# Patient Record
Sex: Male | Born: 1970 | Race: White | Hispanic: No | Marital: Married | Smoking: Former smoker
Health system: Southern US, Community
[De-identification: ages and names within clinical notes are randomized; demographics above are authoritative.]

## PROBLEM LIST (undated history)

## (undated) DIAGNOSIS — I2510 Atherosclerotic heart disease of native coronary artery without angina pectoris: Secondary | ICD-10-CM

## (undated) DIAGNOSIS — I10 Essential (primary) hypertension: Secondary | ICD-10-CM

## (undated) DIAGNOSIS — E785 Hyperlipidemia, unspecified: Secondary | ICD-10-CM

## (undated) DIAGNOSIS — J309 Allergic rhinitis, unspecified: Secondary | ICD-10-CM

## (undated) HISTORY — DX: Allergic rhinitis, unspecified: J30.9

## (undated) HISTORY — PX: NO PAST SURGERIES: SHX2092

## (undated) HISTORY — DX: Essential (primary) hypertension: I10

## (undated) HISTORY — DX: Atherosclerotic heart disease of native coronary artery without angina pectoris: I25.10

## (undated) HISTORY — DX: Hyperlipidemia, unspecified: E78.5

## (undated) HISTORY — PX: ELBOW SURGERY: SHX618

---

## 2007-05-03 DIAGNOSIS — R030 Elevated blood-pressure reading, without diagnosis of hypertension: Secondary | ICD-10-CM | POA: Insufficient documentation

## 2007-05-06 DIAGNOSIS — I10 Essential (primary) hypertension: Secondary | ICD-10-CM | POA: Insufficient documentation

## 2007-05-08 ENCOUNTER — Emergency Department (HOSPITAL_COMMUNITY): Admission: EM | Admit: 2007-05-08 | Discharge: 2007-05-08 | Payer: Self-pay | Admitting: Family Medicine

## 2008-09-09 ENCOUNTER — Emergency Department (HOSPITAL_COMMUNITY): Admission: EM | Admit: 2008-09-09 | Discharge: 2008-09-09 | Payer: Self-pay | Admitting: Emergency Medicine

## 2008-09-13 ENCOUNTER — Emergency Department: Payer: Self-pay | Admitting: Emergency Medicine

## 2009-08-21 DIAGNOSIS — J309 Allergic rhinitis, unspecified: Secondary | ICD-10-CM | POA: Insufficient documentation

## 2010-12-02 LAB — CBC
HCT: 44.3
Hemoglobin: 15.3
MCHC: 34.5
MCV: 87.5
Platelets: 268
RBC: 5.07
RDW: 11.7
WBC: 7.7

## 2010-12-02 LAB — DIFFERENTIAL
Basophils Absolute: 0.1
Basophils Relative: 1
Eosinophils Absolute: 0.2
Eosinophils Relative: 2
Lymphocytes Relative: 40
Lymphs Abs: 3.1
Monocytes Absolute: 0.6
Monocytes Relative: 8
Neutro Abs: 3.7
Neutrophils Relative %: 49

## 2010-12-02 LAB — I-STAT 8, (EC8 V) (CONVERTED LAB)
BUN: 11
Bicarbonate: 23.9
Chloride: 107
Glucose, Bld: 86
HCT: 46
Hemoglobin: 15.6
Operator id: 196461
Potassium: 4.3
Sodium: 138
TCO2: 25
pCO2, Ven: 37.9 — ABNORMAL LOW
pH, Ven: 7.408 — ABNORMAL HIGH

## 2010-12-02 LAB — POCT CARDIAC MARKERS
CKMB, poc: <1 — ABNORMAL LOW
Myoglobin, poc: 35.5
Operator id: 234501
Troponin i, poc: <0.05

## 2010-12-02 LAB — POCT I-STAT CREATININE
Creatinine, Ser: 1
Operator id: 196461

## 2012-09-23 ENCOUNTER — Ambulatory Visit: Payer: Self-pay | Admitting: Family Medicine

## 2015-03-24 ENCOUNTER — Ambulatory Visit (INDEPENDENT_AMBULATORY_CARE_PROVIDER_SITE_OTHER): Payer: BC Managed Care – PPO | Admitting: Family Medicine

## 2015-03-24 ENCOUNTER — Encounter: Payer: Self-pay | Admitting: Family Medicine

## 2015-03-24 VITALS — BP 118/84 | HR 100 | Temp 98.9°F | Resp 17 | Wt 138.0 lb

## 2015-03-24 DIAGNOSIS — J069 Acute upper respiratory infection, unspecified: Secondary | ICD-10-CM | POA: Diagnosis not present

## 2015-03-24 DIAGNOSIS — Z716 Tobacco abuse counseling: Secondary | ICD-10-CM | POA: Diagnosis not present

## 2015-03-24 DIAGNOSIS — E785 Hyperlipidemia, unspecified: Secondary | ICD-10-CM | POA: Insufficient documentation

## 2015-03-24 DIAGNOSIS — Z720 Tobacco use: Secondary | ICD-10-CM

## 2015-03-24 DIAGNOSIS — R5383 Other fatigue: Secondary | ICD-10-CM | POA: Insufficient documentation

## 2015-03-24 DIAGNOSIS — M47816 Spondylosis without myelopathy or radiculopathy, lumbar region: Secondary | ICD-10-CM | POA: Insufficient documentation

## 2015-03-24 DIAGNOSIS — K529 Noninfective gastroenteritis and colitis, unspecified: Secondary | ICD-10-CM | POA: Insufficient documentation

## 2015-03-24 DIAGNOSIS — M549 Dorsalgia, unspecified: Secondary | ICD-10-CM | POA: Insufficient documentation

## 2015-03-24 MED ORDER — VARENICLINE TARTRATE 0.5 MG X 11 & 1 MG X 42 PO MISC: ORAL | Status: DC

## 2015-03-24 MED ORDER — HYDROCODONE-HOMATROPINE 5-1.5 MG/5ML PO SYRP: ORAL_SOLUTION | ORAL | Status: DC

## 2015-03-24 NOTE — Progress Notes (Signed)
Subjective:     Patient ID: Kyle KempfCharles D Clark, male   DOB: [birth year], 45 y.o.   MRN: [id number]  HPI  Chief Complaint  Patient presents with  . Sinus Problem    Patient reports fort the past 6 days he has congestion in his head and chest. Patient states "my head feels like its going to explode!", patient states that he has tried taking otc cough syrup and tylenol. Associated symptoms include: ear pressure/popping, barky like cough, runny nose and post nasal drainage.   . Nicotine Dependence    Patient would like to disucss today quiting smoking and starting back on Chantix, patient reports that he smokes 1/2 a pack a day. Patient last quit smoking 5 years ago then started back a year later, patient has been previously on chantix and statesd that it helped him before.   States sore throat is improving but continues to have PND and intermittently purulent appearing congestion. Reports a grandchild is sick as well. Has decreased smoking while ill.   Review of Systems  Constitutional: Positive for chills. Negative for fever.       Objective:   Physical Exam  Constitutional: He appears well-developed. No distress.  Ears: T.M's intact without inflammation Sinuses: mild maxillary sinus tenderness Throat: no tonsillar enlargement or exudate Neck: no cervical adenopathy Lungs: clear     Assessment:    1. Upper respiratory infectio - HYDROcodone-homatropine (HYCODAN) 5-1.5 MG/5ML syrup; 5 ml 4-6 hours as needed for cough  Dispense: 240 mL; Refill: 0  2. Encounter for smoking cessation counseling - varenicline (CHANTIX STARTING MONTH PAK) 0.5 MG X 11 & 1 MG X 42 tablet; Take one 0.5 mg tablet by mouth once daily for 3 days, then increase to one 0.5 mg tablet twice daily for 4 days, then increase to one 1 mg tablet twice daily.  Dispense: 53 tablet; Refill: 0    Plan:    Discussed use of topical and oral decongestants.

## 2015-03-24 NOTE — Patient Instructions (Signed)
Discussed use of Mucinex D and a nasal decongestant spray like Afrin for no more than 3 days. Call if sinuses not improving over the next two day.

## 2015-04-26 ENCOUNTER — Telehealth: Payer: Self-pay | Admitting: Family Medicine

## 2015-04-26 ENCOUNTER — Other Ambulatory Visit: Payer: Self-pay | Admitting: Family Medicine

## 2015-04-26 DIAGNOSIS — Z720 Tobacco use: Secondary | ICD-10-CM

## 2015-04-26 MED ORDER — VARENICLINE TARTRATE 1 MG PO TABS: 1.0000 mg | ORAL_TABLET | Freq: Two times a day (BID) | ORAL | Status: DC

## 2015-04-26 NOTE — Telephone Encounter (Signed)
Pt contacted office for refill request on the following medications:  varenicline (CHANTIX STARTING MONTH PAK) 0.5 MG X 11 & 1 MG X 42 tablet.  [facility] [address] [facility].  CB#[phone]/MW   Pt is requesting the 2nd dosage/MW

## 2015-04-26 NOTE — Telephone Encounter (Signed)
Please review. Thanks!  

## 2015-04-26 NOTE — Telephone Encounter (Signed)
Continuation pack sent in.

## 2015-04-26 NOTE — Telephone Encounter (Signed)
Advised pt's wife as below.  

## 2016-02-19 ENCOUNTER — Emergency Department
Admission: EM | Admit: 2016-02-19 | Discharge: 2016-02-20 | Disposition: A | Discharge status: Home / Self Care | Payer: BC Managed Care – PPO | Attending: Emergency Medicine | Admitting: Emergency Medicine

## 2016-02-19 ENCOUNTER — Encounter: Payer: Self-pay | Admitting: Emergency Medicine

## 2016-02-19 DIAGNOSIS — S0181XA Laceration without foreign body of other part of head, initial encounter: Secondary | ICD-10-CM | POA: Diagnosis not present

## 2016-02-19 DIAGNOSIS — Y999 Unspecified external cause status: Secondary | ICD-10-CM | POA: Insufficient documentation

## 2016-02-19 DIAGNOSIS — F17210 Nicotine dependence, cigarettes, uncomplicated: Secondary | ICD-10-CM | POA: Insufficient documentation

## 2016-02-19 DIAGNOSIS — Y939 Activity, unspecified: Secondary | ICD-10-CM | POA: Insufficient documentation

## 2016-02-19 DIAGNOSIS — Z79899 Other long term (current) drug therapy: Secondary | ICD-10-CM | POA: Insufficient documentation

## 2016-02-19 DIAGNOSIS — Y929 Unspecified place or not applicable: Secondary | ICD-10-CM | POA: Insufficient documentation

## 2016-02-19 DIAGNOSIS — Z23 Encounter for immunization: Secondary | ICD-10-CM | POA: Insufficient documentation

## 2016-02-19 DIAGNOSIS — I10 Essential (primary) hypertension: Secondary | ICD-10-CM | POA: Diagnosis not present

## 2016-02-19 MED ORDER — LIDOCAINE HCL (PF) 1 % IJ SOLN
INTRAMUSCULAR | Status: AC
  Administered 2016-02-20: 5 mL via INTRADERMAL
  Filled 2016-02-19: qty 5
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
## 2016-02-20 MED ORDER — LIDOCAINE HCL (PF) 1 % IJ SOLN
5.0000 mL | Freq: Once | INTRAMUSCULAR | Status: AC
  Administered 2016-02-20: 5 mL via INTRADERMAL

## 2016-02-20 MED ORDER — TETANUS-DIPHTH-ACELL PERTUSSIS 5-2.5-18.5 LF-MCG/0.5 IM SUSP
0.5000 mL | Freq: Once | INTRAMUSCULAR | Status: AC
  Administered 2016-02-20: 0.5 mL via INTRAMUSCULAR
  Filled 2016-02-20: qty 0.5

## 2016-02-20 NOTE — ED Notes (Signed)
Pt found in room att 
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
## 2016-02-25 ENCOUNTER — Encounter: Payer: Self-pay | Admitting: Family Medicine

## 2016-02-25 ENCOUNTER — Ambulatory Visit (INDEPENDENT_AMBULATORY_CARE_PROVIDER_SITE_OTHER): Payer: BC Managed Care – PPO | Admitting: Family Medicine

## 2016-02-25 ENCOUNTER — Encounter: Payer: BC Managed Care – PPO | Admitting: Family Medicine

## 2016-02-25 VITALS — BP 128/88 | HR 75 | Temp 98.3°F | Resp 16 | Wt 145.4 lb

## 2016-02-25 DIAGNOSIS — S01112D Laceration without foreign body of left eyelid and periocular area, subsequent encounter: Secondary | ICD-10-CM | POA: Diagnosis not present

## 2016-02-25 DIAGNOSIS — S0181XD Laceration without foreign body of other part of head, subsequent encounter: Secondary | ICD-10-CM | POA: Diagnosis not present
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
## 2016-02-25 NOTE — Patient Instructions (Signed)
Facial Laceration  A facial laceration is a cut on the face. These injuries can be painful and cause bleeding. Lacerations usually heal quickly, but they need special care to reduce scarring. DIAGNOSIS  Your health care provider will take a medical history, ask for details about how the injury occurred, and examine the wound to determine how deep the cut is. TREATMENT  Some facial lacerations may not require closure. Others may not be able to be closed because of an increased risk of infection. The risk of infection and the chance for successful closure will depend on various factors, including the amount of time since the injury occurred. The wound may be cleaned to help prevent infection. If closure is appropriate, pain medicines may be given if needed. Your health care provider will use stitches (sutures), wound glue (adhesive), or skin adhesive strips to repair the laceration. These tools bring the skin edges together to allow for faster healing and a better cosmetic outcome. If needed, you may also be given a tetanus shot. HOME CARE INSTRUCTIONS  Only take over-the-counter or prescription medicines as directed by your health care provider.  Follow your health care provider's instructions for wound care. These instructions will vary depending on the technique used for closing the wound. For Sutures:   Keep the wound clean and dry.   If you were given a bandage (dressing), you should change it at least once a day. Also change the dressing if it becomes wet or dirty, or as directed by your health care provider.   Wash the wound with soap and water 2 times a day. Rinse the wound off with water to remove all soap. Pat the wound dry with a clean towel.   After cleaning, apply a thin layer of the antibiotic ointment recommended by your health care provider. This will help prevent infection and keep the dressing from sticking.   You may shower as usual after the first 24 hours. Do not soak the  wound in water until the sutures are removed.   Get your sutures removed as directed by your health care provider. With facial lacerations, sutures should usually be taken out after 4-5 days to avoid stitch marks.   Wait a few days after your sutures are removed before applying any makeup. For Skin Adhesive Strips:   Keep the wound clean and dry.   Do not get the skin adhesive strips wet. You may bathe carefully, using caution to keep the wound dry.   If the wound gets wet, pat it dry with a clean towel.   Skin adhesive strips will fall off on their own. You may trim the strips as the wound heals. Do not remove skin adhesive strips that are still stuck to the wound. They will fall off in time.  For Wound Adhesive:   You may briefly wet your wound in the shower or bath. Do not soak or scrub the wound. Do not swim. Avoid periods of heavy sweating until the skin adhesive has fallen off on its own. After showering or bathing, gently pat the wound dry with a clean towel.   Do not apply liquid medicine, cream medicine, ointment medicine, or makeup to your wound while the skin adhesive is in place. This may loosen the film before your wound is healed.   If a dressing is placed over the wound, be careful not to apply tape directly over the skin adhesive. This may cause the adhesive to be pulled off before the wound is healed.  Avoid prolonged exposure to sunlight or tanning lamps while the skin adhesive is in place.  The skin adhesive will usually remain in place for 5-10 days, then naturally fall off the skin. Do not pick at the adhesive film.  After Healing:  Once the wound has healed, cover the wound with sunscreen during the day for 1 full year. This can help minimize scarring. Exposure to ultraviolet light in the first year will darken the scar. It can take 1-2 years for the scar to lose its redness and to heal completely.  SEEK MEDICAL CARE IF:  You have a fever. SEEK IMMEDIATE  MEDICAL CARE IF:  You have redness, pain, or swelling around the wound.   You see ayellowish-white fluid (pus) coming from the wound.  This information is not intended to replace advice given to you by your health care provider. Make sure you discuss any questions you have with your health care provider. Document Released: 04/06/2004 Document Revised: 03/20/2014 Document Reviewed: 10/10/2012 Elsevier Interactive Patient Education  2017 [employer]

## 2017-05-14 ENCOUNTER — Ambulatory Visit: Payer: BC Managed Care – PPO | Admitting: Family Medicine

## 2017-05-14 ENCOUNTER — Encounter: Payer: Self-pay | Admitting: Family Medicine

## 2017-05-14 VITALS — BP 130/84 | HR 86 | Temp 98.2°F | Wt 146.6 lb

## 2017-05-14 DIAGNOSIS — E782 Mixed hyperlipidemia: Secondary | ICD-10-CM | POA: Diagnosis not present

## 2017-05-14 DIAGNOSIS — I499 Cardiac arrhythmia, unspecified: Secondary | ICD-10-CM

## 2017-05-14 DIAGNOSIS — R42 Dizziness and giddiness: Secondary | ICD-10-CM | POA: Diagnosis not present

## 2017-05-14 DIAGNOSIS — Z114 Encounter for screening for human immunodeficiency virus [HIV]: Secondary | ICD-10-CM

## 2017-05-14 NOTE — Progress Notes (Signed)
Patient: Kyle KempfCharles D Clark Male    DOB: Oct 07, 1970   47 y.o.   MRN: [id number] Visit Date: 05/14/2017  Today's Provider: Dortha Kernennis Shamon Lobo, PA   Chief Complaint  Patient presents with  . Cough  . Dizziness   Subjective:    Dizziness  This is a new problem. Episode onset: couple weeks ago. The problem occurs intermittently. The problem has been unchanged. Associated symptoms include coughing. Pertinent negatives include no headaches. Associated symptoms comments: Syncope . The symptoms are aggravated by coughing. He has tried nothing for the symptoms.   Past Medical History:  Diagnosis Date  . Allergic rhinitis   . Hyperlipidemia   . Hypertension    Past Surgical History:  Procedure Laterality Date  . NO PAST SURGERIES     Family History  Problem Relation Age of Onset  . Healthy Mother   . Congestive Heart Failure Father   . Healthy Sister   . Healthy Daughter    Allergies  Allergen Reactions  . Lisinopril Palpitations    Other reaction(s): Rapid pulse High blood pressure     Current Outpatient Medications:  .  Probiotic CAPS, Take by mouth., Disp: , Rfl:   Review of Systems  Constitutional: Negative.   Eyes: Negative for visual disturbance.  Respiratory: Positive for cough.   Cardiovascular: Negative.   Neurological: Positive for dizziness. Negative for headaches.   Social History   Tobacco Use  . Smoking status: Current Every Day Smoker    Packs/day: 0.50    Years: 25.00    Pack years: 12.50    Types: Cigarettes  . Smokeless tobacco: Never Used  Substance Use Topics  . Alcohol use: Yes    Comment: occasional alcohol use, drinks beer on weekends maybe once a week   Objective:   BP 130/84 (BP Location: Right Arm, Patient Position: Sitting, Cuff Size: Normal)   Pulse 86   Temp 98.2 F (36.8 C) (Oral)   Wt 146 lb 9.6 oz (66.5 kg)   SpO2 97%   BMI 22.29 kg/m   Physical Exam  Constitutional: He is oriented to person, place, and time. He  appears well-developed and well-nourished. No distress.  HENT:  Head: Normocephalic and atraumatic.  Right Ear: Hearing and external ear normal.  Left Ear: Hearing and external ear normal.  Nose: Nose normal.  Eyes: Conjunctivae and lids are normal. Right eye exhibits no discharge. Left eye exhibits no discharge. No scleral icterus.  Neck: Neck supple. No JVD present.  Cardiovascular: Normal rate, normal heart sounds and intact distal pulses.  Intermittent extra heartbeat - questionable PVC.  Pulmonary/Chest: Effort normal and breath sounds normal. No respiratory distress.  Abdominal: Soft. Bowel sounds are normal.  Musculoskeletal: Normal range of motion.  Lymphadenopathy:    He has no cervical adenopathy.  Neurological: He is alert and oriented to person, place, and time. He has normal reflexes.  No ataxia or nystagmus.  Skin: Skin is intact. No lesion and no rash noted.  Psychiatric: He has a normal mood and affect. His speech is normal and behavior is normal. Thought content normal.      Assessment & Plan:     1. Dizziness Onset over the past several days. Has had some hard/hacking coughs that will make the lightheaded sensation worse. Some of the dizziness, and near syncope, has occurred before the coughing started. May use Delsym for cough and get labs to rule out metabolic disorder. EKG today captured only one PVC. If labs  normal, will need to consider beta blocker and/or cardiology referral. - Comprehensive metabolic panel - EKG 12-Lead - TSH - CBC with Differential/Platelet  2. Irregular heartbeat EKG shows occasional PVC's. Check labs for electrolyte imbalance, anemia, hypovolemia or thyroid disorder. May need to get back on the Metoprolol he use to take for high blood pressure.  - Comprehensive metabolic panel - EKG 12-Lead - TSH - CBC with Differential/Platelet  3. Mixed hyperlipidemia Has had elevation of triglycerides and LDL in the past. Needs recheck of labs to  assess condition. - Lipid Profile - Comprehensive metabolic panel - TSH - CBC with Differential/Platelet  4. Screening for HIV (human immunodeficiency virus) - HIV antibody       Dortha Kern, PA  [facility]

## 2017-05-16 LAB — COMPREHENSIVE METABOLIC PANEL
ALT: 32 IU/L (ref 0–44)
AST: 22 IU/L (ref 0–40)
Albumin/Globulin Ratio: 1.8 (ref 1.2–2.2)
Albumin: 4.8 g/dL (ref 3.5–5.5)
Alkaline Phosphatase: 104 IU/L (ref 39–117)
BUN/Creatinine Ratio: 13 (ref 9–20)
BUN: 12 mg/dL (ref 6–24)
Bilirubin Total: 0.4 mg/dL (ref 0.0–1.2)
CO2: 18 mmol/L — ABNORMAL LOW (ref 20–29)
Calcium: 9.9 mg/dL (ref 8.7–10.2)
Chloride: 102 mmol/L (ref 96–106)
Creatinine, Ser: 0.93 mg/dL (ref 0.76–1.27)
GFR calc Af Amer: 113 mL/min/{1.73_m2} (ref >59)
GFR calc non Af Amer: 98 mL/min/{1.73_m2} (ref >59)
Globulin, Total: 2.6 g/dL (ref 1.5–4.5)
Glucose: 91 mg/dL (ref 65–99)
Potassium: 4.6 mmol/L (ref 3.5–5.2)
Sodium: 141 mmol/L (ref 134–144)
Total Protein: 7.4 g/dL (ref 6.0–8.5)

## 2017-05-16 LAB — CBC WITH DIFFERENTIAL/PLATELET
Basophils Absolute: 0 10*3/uL (ref 0.0–0.2)
Basos: 0 %
EOS (ABSOLUTE): 0.2 10*3/uL (ref 0.0–0.4)
Eos: 2 %
Hematocrit: 50.2 % (ref 37.5–51.0)
Hemoglobin: 17.8 g/dL — ABNORMAL HIGH (ref 13.0–17.7)
Immature Grans (Abs): 0 10*3/uL (ref 0.0–0.1)
Immature Granulocytes: 0 %
Lymphocytes Absolute: 3.9 10*3/uL — ABNORMAL HIGH (ref 0.7–3.1)
Lymphs: 52 %
MCH: 30.8 pg (ref 26.6–33.0)
MCHC: 35.5 g/dL (ref 31.5–35.7)
MCV: 87 fL (ref 79–97)
Monocytes Absolute: 0.6 10*3/uL (ref 0.1–0.9)
Monocytes: 9 %
Neutrophils Absolute: 2.7 10*3/uL (ref 1.4–7.0)
Neutrophils: 37 %
Platelets: 248 10*3/uL (ref 150–379)
RBC: 5.78 x10E6/uL (ref 4.14–5.80)
RDW: 12.7 % (ref 12.3–15.4)
WBC: 7.4 10*3/uL (ref 3.4–10.8)

## 2017-05-16 LAB — LIPID PANEL
Chol/HDL Ratio: 4.7 ratio (ref 0.0–5.0)
Cholesterol, Total: 193 mg/dL (ref 100–199)
HDL: 41 mg/dL (ref >39)
LDL Calculated: 125 mg/dL — ABNORMAL HIGH (ref 0–99)
Triglycerides: 134 mg/dL (ref 0–149)
VLDL Cholesterol Cal: 27 mg/dL (ref 5–40)

## 2017-05-16 LAB — HIV ANTIBODY (ROUTINE TESTING W REFLEX): HIV Screen 4th Generation wRfx: NONREACTIVE

## 2017-05-16 LAB — TSH: TSH: 1.02 u[IU]/mL (ref 0.450–4.500)

## 2018-04-02 ENCOUNTER — Ambulatory Visit: Payer: BC Managed Care – PPO | Admitting: Family Medicine

## 2018-04-02 ENCOUNTER — Encounter: Payer: Self-pay | Admitting: Family Medicine

## 2018-04-02 VITALS — BP 145/90 | HR 80 | Temp 97.7°F | Resp 16 | Wt 148.4 lb

## 2018-04-02 DIAGNOSIS — J0100 Acute maxillary sinusitis, unspecified: Secondary | ICD-10-CM

## 2018-04-02 MED ORDER — AMOXICILLIN 875 MG PO TABS: 875.0000 mg | ORAL_TABLET | Freq: Two times a day (BID) | ORAL | 0 refills | Status: DC

## 2018-04-02 NOTE — Progress Notes (Signed)
Patient: Kyle Clark Male    DOB: [birth year]   48 y.o.   MRN: [id number] Visit Date: 04/02/2018  Today's Provider: Dortha Kern, PA   Chief Complaint  Patient presents with  . URI   Subjective:     URI   This is a new problem. The current episode started 1 to 4 weeks ago (Started two weeks ago with a cough, but in the last week he has a lot pressure in his head). The problem has been gradually worsening. There has been no fever. Associated symptoms include congestion, headaches, a plugged ear sensation, rhinorrhea and sinus pain. Pertinent negatives include no chest pain, coughing, ear pain, sore throat or wheezing. Treatments tried: Alka seltzer cold and congestion. The treatment provided mild relief.   Past Medical History:  Diagnosis Date  . Allergic rhinitis   . Hyperlipidemia   . Hypertension    Past Surgical History:  Procedure Laterality Date  . NO PAST SURGERIES     Family History  Problem Relation Age of Onset  . Healthy Mother   . Congestive Heart Failure Father   . Healthy Sister   . Healthy Daughter    Allergies  Allergen Reactions  . Lisinopril Palpitations    Other reaction(s): Rapid pulse High blood pressure     Current Outpatient Medications:  .  Probiotic CAPS, Take by mouth., Disp: , Rfl:   Review of Systems  Constitutional: Negative for chills and fever.  HENT: Positive for congestion, postnasal drip, rhinorrhea, sinus pressure and sinus pain. Negative for ear pain and sore throat.   Respiratory: Negative for cough, chest tightness, shortness of breath and wheezing.   Cardiovascular: Negative for chest pain.  Neurological: Positive for headaches.   Social History   Tobacco Use  . Smoking status: Current Every Day Smoker    Packs/day: 0.50    Years: 25.00    Pack years: 12.50    Types: Cigarettes  . Smokeless tobacco: Never Used  Substance Use Topics  . Alcohol use: Yes    Comment: occasional alcohol use, drinks beer on  weekends maybe once a week     Objective:   BP (!) 145/90 (BP Location: Right Arm, Cuff Size: Normal)   Pulse 80   Temp 97.7 F (36.5 C) (Oral)   Resp 16   Wt 148 lb 6.4 oz (67.3 kg)   SpO2 97%   BMI 22.56 kg/m  Vitals:   04/02/18 1016 04/02/18 1023  BP: (!) 150/90 (!) 145/90  Pulse: 82 80  Resp: 16   Temp: 97.7 F (36.5 C)   TempSrc: Oral   SpO2: 97%   Weight: 148 lb 6.4 oz (67.3 kg)    Physical Exam Constitutional:      General: He is not in acute distress.    Appearance: He is well-developed.  HENT:     Head: Normocephalic and atraumatic.     Right Ear: Hearing, tympanic membrane and ear canal normal.     Left Ear: Hearing, tympanic membrane and ear canal normal.     Nose: Nose normal.     Comments: Cloudy transillumination right maxillary sinus with teeth aching. Red septal membranes of nose. Eyes:     General: Lids are normal. No scleral icterus.       Right eye: No discharge.        Left eye: No discharge.     Conjunctiva/sclera: Conjunctivae normal.  Neck:     Musculoskeletal: Neck supple.  Cardiovascular:     Rate and Rhythm: Normal rate and regular rhythm.     Heart sounds: Normal heart sounds.  Pulmonary:     Effort: Pulmonary effort is normal. No respiratory distress.     Breath sounds: Normal breath sounds.  Musculoskeletal: Normal range of motion.  Lymphadenopathy:     Cervical: No cervical adenopathy.  Skin:    Findings: No lesion or rash.  Neurological:     Mental Status: He is alert and oriented to person, place, and time.  Psychiatric:        Speech: Speech normal.        Behavior: Behavior normal.        Thought Content: Thought content normal.       Assessment & Plan    1. Subacute maxillary sinusitis Started with a cough and congestion 2 weeks ago. Cough cleared but head congestion, headache, PND and stuffy head with ears popping started on 03-29-18. No fever now but some occasional purulent nasal mucus. May use antihistamine and/or  expectorant with steroid nasal spray. Add Amoxil for sinusitis and increase fluid intake. Recheck in 5-7 days if no better. - amoxicillin (AMOXIL) 875 MG tablet; Take 1 tablet (875 mg total) by mouth 2 (two) times daily.  Dispense: 20 tablet; Refill: 0     Dortha Kern, PA  [facility]

## 2018-05-21 ENCOUNTER — Telehealth: Payer: Self-pay | Admitting: Family Medicine

## 2018-05-21 NOTE — Telephone Encounter (Signed)
Should be seen to be sure further testing for chronic sinusitis is not needed.

## 2018-05-21 NOTE — Telephone Encounter (Signed)
Pt's wife called saying her husband has another sinus infection.  He was just in about a month ago for one.  She wants to know if Maurine Minister will send an antibiotic to the pharmacy.  [facility] [address]  CB#  [phone]  Thanks Barth Kirks

## 2018-05-21 NOTE — Telephone Encounter (Signed)
Please advise 

## 2018-05-22 ENCOUNTER — Other Ambulatory Visit: Payer: Self-pay

## 2018-05-22 ENCOUNTER — Encounter: Payer: Self-pay | Admitting: Physician Assistant

## 2018-05-22 ENCOUNTER — Ambulatory Visit: Payer: BC Managed Care – PPO | Admitting: Physician Assistant

## 2018-05-22 VITALS — BP 148/96 | HR 109 | Temp 98.1°F | Resp 16 | Wt 146.0 lb

## 2018-05-22 DIAGNOSIS — K047 Periapical abscess without sinus: Secondary | ICD-10-CM | POA: Diagnosis not present

## 2018-05-22 DIAGNOSIS — J0100 Acute maxillary sinusitis, unspecified: Secondary | ICD-10-CM | POA: Diagnosis not present

## 2018-05-22 MED ORDER — FLUTICASONE PROPIONATE 50 MCG/ACT NA SUSP: 2.0000 | Freq: Every day | NASAL | 6 refills | Status: DC

## 2018-05-22 MED ORDER — AMOXICILLIN 875 MG PO TABS: 875.0000 mg | ORAL_TABLET | Freq: Two times a day (BID) | ORAL | 0 refills | Status: DC

## 2018-05-22 NOTE — Progress Notes (Signed)
Patient: Kyle Clark Male    DOB: Aug 06, 1970   48 y.o.   MRN: [id number] Visit Date: 05/22/2018  Today's Provider: Margaretann Loveless, PA-C   Chief Complaint  Patient presents with  . URI   Subjective:    Upper Respiratory Infection: Patient complains of possible sinusitis. Symptoms include plugged sensation in the left ear and facial pain. Onset of symptoms was 3 days ago, unchanged since that time. He also c/o facial pain for the past 5 days .  He is drinking plenty of fluids. Evaluation to date: seen previously and thought to have a sinusitis. Treatment to date: antibiotics. Was treated on 04/02/18 with amoxil for sinusitis. Symptoms improved until Friday last week. Developed severe pain in the left jaw area that radiated up the left cheek in front of the ear and to the left frontal area. Some swelling over the left cheek over the weekend.    Allergies  Allergen Reactions  . Lisinopril Palpitations    Other reaction(s): Rapid pulse High blood pressure      Current Outpatient Medications:  .  Probiotic CAPS, Take by mouth., Disp: , Rfl:   Review of Systems  Constitutional: Negative.   HENT: Positive for congestion, dental problem, ear pain, facial swelling, rhinorrhea (clear), sinus pressure and sinus pain. Negative for ear discharge, postnasal drip, sneezing and sore throat.   Respiratory: Negative.   Cardiovascular: Negative.   Gastrointestinal: Negative.   Neurological: Positive for headaches. Negative for dizziness.    Social History   Tobacco Use  . Smoking status: Current Every Day Smoker    Packs/day: 0.50    Years: 25.00    Pack years: 12.50    Types: Cigarettes  . Smokeless tobacco: Never Used  Substance Use Topics  . Alcohol use: Yes    Comment: occasional alcohol use, drinks beer on weekends maybe once a week      Objective:   BP (!) 148/96 (BP Location: Left Arm, Patient Position: Sitting, Cuff Size: Normal)   Pulse (!) 109   Temp 98.1  F (36.7 C)   Resp 16   Wt 146 lb (66.2 kg)   SpO2 96%   BMI 22.20 kg/m  Vitals:   05/22/18 1152  BP: (!) 148/96  Pulse: (!) 109  Resp: 16  Temp: 98.1 F (36.7 C)  SpO2: 96%  Weight: 146 lb (66.2 kg)     Physical Exam Vitals signs reviewed.  Constitutional:      General: He is not in acute distress.    Appearance: Normal appearance. He is well-developed and normal weight. He is not diaphoretic.  HENT:     Head: Normocephalic and atraumatic.     Right Ear: Hearing, tympanic membrane, ear canal and external ear normal. No middle ear effusion. Tympanic membrane is not erythematous or bulging.     Left Ear: Hearing, tympanic membrane, ear canal and external ear normal.  No middle ear effusion. Tympanic membrane is not erythematous or bulging.     Nose: Mucosal edema and rhinorrhea present. No congestion.     Right Sinus: No maxillary sinus tenderness or frontal sinus tenderness.     Left Sinus: No maxillary sinus tenderness or frontal sinus tenderness.     Mouth/Throat:     Mouth: Mucous membranes are moist.     Dentition: Abnormal dentition. Dental tenderness, dental caries and dental abscesses present.     Pharynx: Uvula midline. No oropharyngeal exudate or posterior oropharyngeal erythema.  Eyes:     General:        Right eye: No discharge.        Left eye: No discharge.     Conjunctiva/sclera: Conjunctivae normal.     Pupils: Pupils are equal, round, and reactive to light.  Neck:     Musculoskeletal: Normal range of motion and neck supple.     Thyroid: No thyromegaly.     Trachea: No tracheal deviation.     Meningeal: Brudzinski's sign and Kernig's sign absent.  Cardiovascular:     Rate and Rhythm: Normal rate and regular rhythm.     Heart sounds: Normal heart sounds. No murmur. No friction rub. No gallop.   Pulmonary:     Effort: Pulmonary effort is normal. No respiratory distress.     Breath sounds: Normal breath sounds. No stridor. No wheezing or rales.   Lymphadenopathy:     Cervical: No cervical adenopathy.  Skin:    General: Skin is warm and dry.  Neurological:     Mental Status: He is alert.         Assessment & Plan    1. Tooth abscess Worsening. Will treat with Amoxil as below. Discussed using Oragel prn for pain. May use tylenol and IBU alternating every 3 hours for pain. Discussed f/u with dentist. He agrees.  - amoxicillin (AMOXIL) 875 MG tablet; Take 1 tablet (875 mg total) by mouth 2 (two) times daily.  Dispense: 20 tablet; Refill: 0 2. Subacute maxillary sinusitis Continue Zyrtec, add flonase for continued rhinitis.  - fluticasone (FLONASE) 50 MCG/ACT nasal spray; Place 2 sprays into both nostrils daily.  Dispense: 16 g; Refill: 6     Margaretann Loveless, PA-C  [facility]

## 2018-05-22 NOTE — Telephone Encounter (Signed)
Patient's wife was advised.  

## 2018-05-22 NOTE — Patient Instructions (Signed)
Dental Abscess    A dental abscess is a collection of pus in or around a tooth that results from an infection. An abscess can cause pain in the affected area as well as other symptoms. Treatment is important to help with symptoms and to prevent the infection from spreading.  What are the causes?  This condition is caused by a bacterial infection around the root of the tooth that involves the inner part of the tooth (pulp). It may result from:   Severe tooth decay.   Trauma to the tooth, such as a broken or chipped tooth, that allows bacteria to enter into the pulp.   Severe gum disease around a tooth.  What increases the risk?  This condition is more likely to develop in males. It is also more likely to develop in people who:   Have dental decay (cavities).   Eat sugary snacks between meals.   Use tobacco products.   Have diabetes.   Have a weakened disease-fighting system (immune system).   Do not brush and care for their teeth regularly.  What are the signs or symptoms?  Symptoms of this condition include:   Severe pain in and around the infected tooth.   Swelling and redness around the infected tooth, in the mouth, or in the face.   Tenderness.   Pus drainage.   Bad breath.   Bitter taste in the mouth.   Difficulty swallowing.   Difficulty opening the mouth.   Nausea.   Vomiting.   Chills.   Swollen neck glands.   Fever.  How is this diagnosed?  This condition is diagnosed based on:   Your symptoms and your medical and dental history.   An examination of the infected tooth. During the exam, your dentist may tap on the infected tooth.  You may also have X-rays of the affected area.  How is this treated?  This condition is treated by getting rid of the infection. This may be done with:   Incision and drainage. This procedure is done by making an incision in the abscess to drain out the pus. Removing pus is the first priority in treating an abscess.   Antibiotic medicines. These may be used  in certain situations.   Antibacterial mouth rinse.   A root canal. This may be performed to save the tooth. Your dentist accesses the visible part of your tooth (crown) with a drill and removes any damaged pulp. Then the space is filled and sealed off.   Tooth extraction. The tooth is pulled out if it cannot be saved by other treatment.  You may also receive treatment for pain, such as:   Acetaminophen or NSAIDs.   Gels that contain a numbing medicine.   An injection to block the pain near your nerve.  Follow these instructions at home:  Medicines   Take over-the-counter and prescription medicines only as told by your dentist.   If you were prescribed an antibiotic, take it as told by your dentist. Do not stop taking the antibiotic even if you start to feel better.   If you were prescribed a gel that contains a numbing medicine, use it exactly as told in the directions. Do not use these gels for children who are younger than 2 years of age.   Do not drive or use heavy machinery while taking prescription pain medicine.  General instructions   Rinse out your mouth often with salt water to relieve pain or swelling. To make a salt-water   mixture, completely dissolve -1 tsp of salt in 1 cup of warm water.   Eat a soft diet while your abscess is healing.   Drink enough fluid to keep your urine pale yellow.   Do not apply heat to the outside of your mouth.   Do not use any products that contain nicotine or tobacco, such as cigarettes and e-cigarettes. If you need help quitting, ask your health care provider.   Keep all follow-up visits as told by your dentist. This is important.  How is this prevented?   Brush your teeth every morning and night with fluoride toothpaste. Floss one time each day.   Get regularly scheduled dental cleanings.   Consider having a dental sealant applied on teeth that have deep holes (caries).   Drink fluoridated water regularly. This includes most tap water. Check the label  on bottled water to see if it contains fluoride.   Drink water instead of sugary drinks.   Eat healthy meals and snacks.   Wear a mouth guard or face shield to protect your teeth while playing sports.  Contact a health care provider if:   Your pain is worse and is not helped by medicine.  Get help right away if:   You have a fever or chills.   Your symptoms suddenly get worse.   You have a very bad headache.   You have problems breathing or swallowing.   You have trouble opening your mouth.   You have swelling in your neck or around your eye.  Summary   A dental abscess is a collection of pus in or around a tooth that results from an infection.   A dental abscess may result from severe tooth decay, trauma to the tooth, or severe gum disease around a tooth.   Symptoms include severe pain, swelling, redness, and drainage of pus in and around the infected tooth.   The first priority in treating a dental abscess is to drain out the pus. Treatment may also involve removing damage inside the tooth (root canal) or pulling out (extracting) the tooth.  This information is not intended to replace advice given to you by your health care provider. Make sure you discuss any questions you have with your health care provider.  Document Released: 02/27/2005 Document Revised: 10/30/2016 Document Reviewed: 10/30/2016  Elsevier Interactive Patient Education  2019 [employer] Inc[employer]

## 2018-06-03 ENCOUNTER — Telehealth: Payer: Self-pay | Admitting: Family Medicine

## 2018-06-03 DIAGNOSIS — K047 Periapical abscess without sinus: Secondary | ICD-10-CM

## 2018-06-03 MED ORDER — AMOXICILLIN 875 MG PO TABS: 875.0000 mg | ORAL_TABLET | Freq: Two times a day (BID) | ORAL | 0 refills | Status: DC

## 2018-06-03 NOTE — Telephone Encounter (Signed)
lm

## 2018-06-03 NOTE — Telephone Encounter (Signed)
refilled 

## 2018-06-03 NOTE — Telephone Encounter (Signed)
Patient states that when he was here for his office visit on 05/22/2018 that you told him if he needed another round of antibiotics to let you know.  He states that he does not think that the sinus infection is not completely cleared up but is some better.  He uses [facility] [employer].

## 2018-06-06 ENCOUNTER — Telehealth: Payer: Self-pay

## 2018-06-06 NOTE — Telephone Encounter (Signed)
Patient was advised. Expressed understanding.  

## 2018-06-06 NOTE — Telephone Encounter (Signed)
Sinusitis can last a while and need that second round of antibiotics. We will continue to have a lot of these allergy symptoms with the heavy pollen this year. You should continue to monitor your temperature and if it gets over 100 with worsening cough and shortness of breath, you will need to be screened at an Urgent Care or the ED. Otherwise, you can use Mucinex-DM, Flonase Nasal Spray, drink extra fluids and stay home for the next 4-5 days. Let us hear from you Monday.

## 2018-06-06 NOTE — Telephone Encounter (Signed)
Patient reports that he is still taking the second round of antibiotic but he still has been feeling pressure in his head. Report that last night he woke up sweating and with a cough and want to know if he should be concern about anything. Reports that he does have sob but reports that he does smokes. No fever. Check his temperature and it was 97.5 Please advise.

## 2018-06-10 ENCOUNTER — Telehealth: Payer: Self-pay

## 2018-06-10 ENCOUNTER — Encounter: Payer: Self-pay | Admitting: *Deleted

## 2018-06-10 NOTE — Telephone Encounter (Signed)
Patient called to give and update to [facility]. Patients states that her has continued to take the antibiotic, and he has not had any fever and feels better. He states he was told to call back to give and update today.

## 2018-06-10 NOTE — Telephone Encounter (Signed)
Please advise 

## 2018-06-10 NOTE — Telephone Encounter (Signed)
Patient called back requesting a work note stating that he is clear to return to work. Patient states he is unable to go back to work unless he has a note from his doctor saying that he can return to work.

## 2018-06-10 NOTE — Telephone Encounter (Signed)
He may have a note to return to work if he has been free of fever above 100 for the past 3 days without taking antipyretics (Tylenol, Advil, Aleve, etc.)

## 2018-06-10 NOTE — Telephone Encounter (Signed)
Patient states he has been fever free (without taking antipyretic) since 06/05/2018. Letter to return to work was given to patient.

## 2018-06-18 ENCOUNTER — Telehealth: Payer: Self-pay

## 2018-06-18 DIAGNOSIS — K047 Periapical abscess without sinus: Secondary | ICD-10-CM

## 2018-06-18 NOTE — Telephone Encounter (Signed)
Referral placed but may have to call emergency dentist if they are not open.

## 2018-06-18 NOTE — Telephone Encounter (Signed)
Patient's wife states pt went to [facility] this morning and had his tooth pulled.

## 2018-06-18 NOTE — Telephone Encounter (Signed)
Patient wife Kyle Clark called and stated patient is having severe dental pain  and wants to know if you could please referral him to [facility]. Patient and wife states they know most dentist offices are closed but wanted to see if they would see the patient still. Please advise.

## 2019-02-14 ENCOUNTER — Telehealth: Payer: Self-pay | Admitting: *Deleted

## 2019-02-14 ENCOUNTER — Encounter (INDEPENDENT_AMBULATORY_CARE_PROVIDER_SITE_OTHER): Payer: Self-pay

## 2019-02-14 ENCOUNTER — Encounter: Payer: Self-pay | Admitting: Family Medicine

## 2019-02-14 ENCOUNTER — Other Ambulatory Visit: Payer: Self-pay

## 2019-02-14 ENCOUNTER — Ambulatory Visit (INDEPENDENT_AMBULATORY_CARE_PROVIDER_SITE_OTHER): Payer: BC Managed Care – PPO | Admitting: Family Medicine

## 2019-02-14 ENCOUNTER — Telehealth: Payer: Self-pay

## 2019-02-14 DIAGNOSIS — J0100 Acute maxillary sinusitis, unspecified: Secondary | ICD-10-CM

## 2019-02-14 DIAGNOSIS — U071 COVID-19: Secondary | ICD-10-CM

## 2019-02-14 DIAGNOSIS — Z20828 Contact with and (suspected) exposure to other viral communicable diseases: Secondary | ICD-10-CM | POA: Diagnosis not present

## 2019-02-14 DIAGNOSIS — Z20822 Contact with and (suspected) exposure to covid-19: Secondary | ICD-10-CM

## 2019-02-14 MED ORDER — AMOXICILLIN 875 MG PO TABS: 875.0000 mg | ORAL_TABLET | Freq: Two times a day (BID) | ORAL | 0 refills | Status: DC

## 2019-02-14 NOTE — Telephone Encounter (Signed)
Patient called due to receiving BPA for worsening cough. Pt had office visit today and did discuss cough with PCP. Pt advised to drink warm fluids and to try hard candy or cough drops for current symptoms. Pt advised to follow recommendations of PCP and to notify PCP if symptoms do not improve. Pt verbalized understanding. No other questions or concerns voiced at this time.

## 2019-02-14 NOTE — Telephone Encounter (Signed)
Letter printed. Please put it in his MyChart.

## 2019-02-14 NOTE — Telephone Encounter (Signed)
From [facility]   Copied from [place] [phone]. Topic: General - Inquiry >> Feb 14, 2019 10:46 AM [name] wrote: Reason for CRM: Patient states him and his wife Garritt Molyneux went and got tested for covid today.  Patient just wanted PCP to be aware.  Patient states his is more like a sinus infection and would like medication Call back [phone]

## 2019-02-14 NOTE — Progress Notes (Signed)
Patient: Kyle Clark Male    DOB: 12/07/1970   48 y.o.   MRN: [id number] Visit Date: 02/14/2019  Today's Provider: Vernie Murders, PA   No chief complaint on file.  Subjective:    Virtual Visit via Telephone Note  I connected with Kyle Clark on 02/14/19 at  3:40 PM EST by telephone and verified that I am speaking with the correct person using two identifiers.  Location: Patient: Home Provider: Office   I discussed the limitations, risks, security and privacy concerns of performing an evaluation and management service by telephone and the availability of in person appointments. I also discussed with the patient that there may be a patient responsible charge related to this service. The patient expressed understanding and agreed to proceed.  HPI: This 48 year old male developed scratchy throat, cough, right maxillary sinus headache, stuffy nose and some sweating 3 days ago. Found out a family member he was around over Thanksgiving tested positive for COVID-19 yesterday. Denies any fever and only minimal drop in sense of taste and smell.  Past Medical History:  Diagnosis Date  . Allergic rhinitis   . Hyperlipidemia   . Hypertension    Past Surgical History:  Procedure Laterality Date  . NO PAST SURGERIES     Family History  Problem Relation Age of Onset  . Healthy Mother   . Congestive Heart Failure Father   . Healthy Sister   . Healthy Daughter    Allergies  Allergen Reactions  . Lisinopril Palpitations    Other reaction(s): Rapid pulse High blood pressure     Current Outpatient Medications:  .  amoxicillin (AMOXIL) 875 MG tablet, Take 1 tablet (875 mg total) by mouth 2 (two) times daily., Disp: 20 tablet, Rfl: 0 .  fluticasone (FLONASE) 50 MCG/ACT nasal spray, Place 2 sprays into both nostrils daily., Disp: 16 g, Rfl: 6 .  Probiotic CAPS, Take by mouth., Disp: , Rfl:   Review of Systems  Social History   Tobacco Use  . Smoking status: Current  Every Day Smoker    Packs/day: 0.50    Years: 25.00    Pack years: 12.50    Types: Cigarettes  . Smokeless tobacco: Never Used  Substance Use Topics  . Alcohol use: Yes    Comment: occasional alcohol use, drinks beer on weekends maybe once a week      Objective:   There were no vitals taken for this visit. There were no vitals filed for this visit.There is no height or weight on file to calculate BMI.  Physical Exam: Coughing and sniffles without apparent respiratory distress during telephonic interview.    Assessment & Plan     1. Subacute maxillary sinusitis Having scratchy throat, PND, cough and pain in the right maxillary sinus over the past 2-3 days. May continue Muxinex Max and Tylenol for symptoms. Add Amoxil for sinusitis. Denies fever or chest discomfort. Increase fluid intake.  - amoxicillin (AMOXIL) 875 MG tablet; Take 1 tablet (875 mg total) by mouth 2 (two) times daily.  Dispense: 20 tablet; Refill: 0  2. Exposure to COVID-19 virus Exposed to a family member over Thanksgiving that reported a positive COVID-19 test today. This patient and his wife went to be tested today and awaiting results. Recommend quarantine and home monitoring until results available. Given COVID pandemic restrictions to follow and counseled regarding when additional medical help may be needed. Should go to the ER if severe dyspnea or high  fever develops. - MyChart COVID-19 home monitoring program; Future - Temperature monitoring; Future    I discussed the assessment and treatment plan with the patient. The patient was provided an opportunity to ask questions and all were answered. The patient agreed with the plan and demonstrated an understanding of the instructions.   The patient was advised to call back or seek an in-person evaluation if the symptoms worsen or if the condition fails to improve as anticipated.  I provided 15 minutes of non-face-to-face time during this encounter.      Dortha Kern, PA  [facility]

## 2019-02-14 NOTE — Telephone Encounter (Signed)
Pt needs a letter from his pcp for his employer, stating the recommendations and CDC guidelines for quarantining after exposure. 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  Ok to put [employer].

## 2019-02-15 ENCOUNTER — Encounter (INDEPENDENT_AMBULATORY_CARE_PROVIDER_SITE_OTHER): Payer: Self-pay

## 2019-02-15 LAB — NOVEL CORONAVIRUS, NAA: SARS-CoV-2, NAA: NOT DETECTED

## 2019-02-16 ENCOUNTER — Encounter (INDEPENDENT_AMBULATORY_CARE_PROVIDER_SITE_OTHER): Payer: Self-pay

## 2019-02-17 ENCOUNTER — Encounter (INDEPENDENT_AMBULATORY_CARE_PROVIDER_SITE_OTHER): Payer: Self-pay

## 2019-02-17 ENCOUNTER — Telehealth: Payer: Self-pay

## 2019-02-17 NOTE — Telephone Encounter (Signed)
Letter in chart which should be available by MyChart.

## 2019-02-17 NOTE — Telephone Encounter (Signed)
Source Subject Topic  Kyle Nicely D [name] (Patient) Kyle Nicely D [name] (Patient) General - Inquiry  Summary: work note  Reason for CRM:     Pt states that he needs a work note after seeing PCP and having a COVID test.  Pt can be reached at [phone]    Patient had virtual visit and testing on 02/14/19

## 2019-02-17 NOTE — Telephone Encounter (Unsigned)
Copied from [place] [phone]. Topic: General - Inquiry >> Feb 17, 2019 10:00 AM Reyne Dumas L wrote: Reason for CRM:   Pt states that he needs a work note after seeing PCP and having a COVID test. Pt can be reached at [phone]

## 2019-02-18 ENCOUNTER — Encounter (INDEPENDENT_AMBULATORY_CARE_PROVIDER_SITE_OTHER): Payer: Self-pay

## 2019-02-18 NOTE — Telephone Encounter (Signed)
Pt stated he did not see letter in [place]. Requesting Callback today. Please advise.

## 2019-02-18 NOTE — Telephone Encounter (Signed)
Advised letter is in my chart

## 2019-02-19 ENCOUNTER — Encounter (INDEPENDENT_AMBULATORY_CARE_PROVIDER_SITE_OTHER): Payer: Self-pay

## 2019-02-19 NOTE — Telephone Encounter (Signed)
I called the patient and he said he is not sure he needs the letter now.  He reports that after calling and asking about the letter on 02/14/19 his work allowed him to go back to work on 02/17/19.   However, the patient reports that several family members have Covid and he has had exposure on 02/11/19 and 123/20. His sinus type symptoms have gotten better but he now feels the congestion went into his chest and he is having worsening cough.    Patient was instructed that he should not be at work and will need to begin a new quarantine as of the 02/13/19 exposure and that he will need to be tested again.  I have review the CDC guidelines for anyone that is positive, has symptoms or has had an exposure and patient appears to understand them.    He is to call is symptoms change or worsen.

## 2019-02-20 ENCOUNTER — Other Ambulatory Visit: Payer: Self-pay

## 2019-02-20 ENCOUNTER — Encounter (INDEPENDENT_AMBULATORY_CARE_PROVIDER_SITE_OTHER): Payer: Self-pay

## 2019-02-20 DIAGNOSIS — Z20822 Contact with and (suspected) exposure to covid-19: Secondary | ICD-10-CM

## 2019-02-20 NOTE — Telephone Encounter (Signed)
Patient will need to have a new letter clarifying when to return to work due to exposure

## 2019-02-20 NOTE — Telephone Encounter (Signed)
Agree with this plan and instructions.

## 2019-02-20 NOTE — Telephone Encounter (Signed)
Pt is calling and need a letter that he needs to follow 10 day CDC guideline he needed to be quarantine from dec 4 until he get the results of covid 19 test he took today 02-20-2019

## 2019-02-21 ENCOUNTER — Encounter: Payer: Self-pay | Admitting: Family Medicine

## 2019-02-21 ENCOUNTER — Encounter (INDEPENDENT_AMBULATORY_CARE_PROVIDER_SITE_OTHER): Payer: Self-pay

## 2019-02-21 NOTE — Telephone Encounter (Signed)
Tried to send the letter to the patient. Printed a letter regarding new exposure and another test pending.

## 2019-02-22 ENCOUNTER — Encounter (INDEPENDENT_AMBULATORY_CARE_PROVIDER_SITE_OTHER): Payer: Self-pay

## 2019-02-22 LAB — NOVEL CORONAVIRUS, NAA: SARS-CoV-2, NAA: NOT DETECTED

## 2019-02-23 ENCOUNTER — Encounter (INDEPENDENT_AMBULATORY_CARE_PROVIDER_SITE_OTHER): Payer: Self-pay

## 2019-02-24 ENCOUNTER — Encounter (INDEPENDENT_AMBULATORY_CARE_PROVIDER_SITE_OTHER): Payer: Self-pay

## 2019-02-24 NOTE — Telephone Encounter (Signed)
Patient informed. 

## 2019-02-25 ENCOUNTER — Other Ambulatory Visit: Payer: Self-pay | Admitting: Family Medicine

## 2019-02-25 ENCOUNTER — Encounter (INDEPENDENT_AMBULATORY_CARE_PROVIDER_SITE_OTHER): Payer: Self-pay

## 2019-02-25 DIAGNOSIS — J0100 Acute maxillary sinusitis, unspecified: Secondary | ICD-10-CM

## 2019-02-25 NOTE — Telephone Encounter (Signed)
Pt of Kyle Clark who is out all week

## 2019-02-26 NOTE — Telephone Encounter (Signed)
Would need to be re-evaluated. He has already had one round of antibiotics.

## 2019-04-02 ENCOUNTER — Other Ambulatory Visit: Payer: Self-pay

## 2019-04-02 ENCOUNTER — Ambulatory Visit
Admission: RE | Admit: 2019-04-02 | Discharge: 2019-04-02 | Disposition: A | Discharge status: Home / Self Care | Payer: BC Managed Care – PPO | Source: Ambulatory Visit | Attending: Physician Assistant | Admitting: Physician Assistant

## 2019-04-02 ENCOUNTER — Encounter: Payer: Self-pay | Admitting: Physician Assistant

## 2019-04-02 ENCOUNTER — Ambulatory Visit (INDEPENDENT_AMBULATORY_CARE_PROVIDER_SITE_OTHER): Payer: BC Managed Care – PPO | Admitting: Physician Assistant

## 2019-04-02 VITALS — BP 162/85 | HR 88 | Temp 96.6°F | Resp 18 | Ht 68.0 in | Wt 144.0 lb

## 2019-04-02 DIAGNOSIS — M25521 Pain in right elbow: Secondary | ICD-10-CM | POA: Insufficient documentation

## 2019-04-02 MED ORDER — MELOXICAM 15 MG PO TABS: 15.0000 mg | ORAL_TABLET | Freq: Every day | ORAL | 0 refills | Status: DC

## 2019-04-02 NOTE — Progress Notes (Signed)
Patient: Kyle Clark Male    DOB: [birth year]   49 y.o.   MRN: [id number] Visit Date: 04/02/2019  Today's Provider: Trey Sailors, PA-C   Chief Complaint  Patient presents with  . Elbow Pain   Subjective:     Arm Pain  The incident occurred more than 1 week ago. The injury mechanism is unknown. The pain is present in the right elbow. The quality of the pain is described as aching and shooting. The pain radiates to the right hand. The pain is at a severity of 3/10. The pain is mild. The pain has been constant since the incident. The symptoms are aggravated by movement, lifting and palpation. He has tried nothing for the symptoms.   Right elbow pain x 1 week that is worsening. 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 No sports. No injuries in the past except for when he was working on traffic light and the wind blew a door into his elbow several weeks ago.   Allergies  Allergen Reactions  . Lisinopril Palpitations    Other reaction(s): Rapid pulse High blood pressure      Current Outpatient Medications:  .  fluticasone (FLONASE) 50 MCG/ACT nasal spray, Place 2 sprays into both nostrils daily. (Patient not taking: Reported on 04/02/2019), Disp: 16 g, Rfl: 6 .  Probiotic CAPS, Take by mouth., Disp: , Rfl:   Review of Systems  Constitutional: Negative.   HENT: Negative.   Respiratory: Negative.   Cardiovascular: Negative.   Gastrointestinal: Negative.   Endocrine: Negative.   Genitourinary: Negative.   Musculoskeletal: Positive for arthralgias.  Allergic/Immunologic: Negative.   Neurological: Negative.   Hematological: Negative.   Psychiatric/Behavioral: Negative.     Social History   Tobacco Use  . Smoking status: Current Every Day Smoker    Packs/day: 0.50    Years: 25.00    Pack years: 12.50    Types: Cigarettes  . Smokeless tobacco: Never Used  Substance Use Topics  . Alcohol use: Yes    Comment: occasional alcohol use, drinks beer on  weekends maybe once a week      Objective:   BP (!) 162/85   Pulse 88   Temp (!) 96.6 F (35.9 C) (Temporal)   Resp 18   Ht 5\' 8"  (1.727 m)   Wt 144 lb (65.3 kg)   BMI 21.90 kg/m  Vitals:   04/02/19 1115  BP: (!) 162/85  Pulse: 88  Resp: 18  Temp: (!) 96.6 F (35.9 C)  TempSrc: Temporal  Weight: 144 lb (65.3 kg)  Height: 5\' 8"  (1.727 m)  Body mass index is 21.9 kg/m.   Physical Exam Constitutional:      Appearance: Normal appearance.  Musculoskeletal:     Right elbow: Swelling present. No deformity, effusion or lacerations. Normal range of motion. Tenderness present in lateral epicondyle.     Left elbow: Normal.  Skin:    General: Skin is warm and dry.  Neurological:     Mental Status: He is alert and oriented to person, place, and time. Mental status is at baseline.  Psychiatric:        Mood and Affect: Mood normal.      No results found for any visits on 04/02/19.     Assessment & Plan    Right elbow pain - Plan: DG Elbow Complete Right, meloxicam (MOBIC) 15 MG tablet  DDx fracture vs. Tendonitis. Xray did not reveal fracture and so will  treat with NSAIDs and rest. Follow up if not improving.   The entirety of the information documented in the History of Present Illness, Review of Systems and Physical Exam were personally obtained by me. Portions of this information were initially documented by [facility] and reviewed by me for thoroughness and accuracy.        Trinna Post, PA-C  [facility]

## 2019-04-02 NOTE — Patient Instructions (Signed)
Olecranon Fracture An olecranon fracture is a break in one of the bones of your elbow. Three bones make up your elbow. These include the two bones of your lower arm (ulna and radius) and the single bone of your upper arm (humerus). The tip of your elbow (olecranon) is part of your ulna. You can feel this hard tip when you bend your elbow. It is easily injured because it has very little padding over it. What are the causes? Olecranon fractures commonly occur after falling on a hard surface with a bent elbow. Other causes include:  A forceful hit (blow) to the elbow.  A motor vehicle collision.  Falling onto an outstretched arm.  A forceful twist to the elbow. What increases the risk? You are more likely to develop this condition if you:  Participate in contact sports or sports in which falls on hard surfaces are common.  Are older. This is when falls are more common.  Have thin or weak bones. What are the signs or symptoms? Symptoms of this condition include:  Severe pain, especially when you try to move your elbow.  Swelling.  Bruising.  Stiffness.  Pain or tenderness in your elbow when it is touched.  Numbness in your fingers. How is this diagnosed? This condition is diagnosed based on a physical exam and X-rays. How is this treated? Treatment depends on the severity and location of the fracture. Many fractures can be treated without surgery. Treatment may include:  Taking pain medicine.  Icing the injury to reduce swelling.  Keeping the elbow still with a cast, splint, or sling (immobilization).  Doing physical therapy to restore movement. If the elbow is not stable or if bones are out of place, you may need surgery. You may have pins, wires, screws, or plates inserted into the broken bone. Then, you will need to wear a splint, cast, or sling and have physical therapy to restore movement. Follow these instructions at home: Medicines  Take over-the-counter and  prescription medicines only as told by your health care provider.  Ask your health care provider if the medicine prescribed to you: ? Requires you to avoid driving or using heavy machinery. ? Can cause constipation. You may need to take actions to prevent or treat constipation, such as:  Drink enough fluid to keep your urine pale yellow.  Take over-the-counter or prescription medicines.  Eat foods that are high in fiber, such as beans, whole grains, and fresh fruits and vegetables.  Limit foods that are high in fat and processed sugars, such as fried or sweet foods. If you have a splint or sling:  Wear the splint or sling as told by your health care provider. Remove it only as told by your health care provider.  Loosen the splint or sling if your fingers tingle, become numb, or turn cold and blue.  Keep the splint or sling clean and dry. If you have a cast:  Do not put pressure on any part of the cast until it is fully hardened. This may take several hours.  Do not stick anything inside the cast to scratch your skin. Doing that increases your risk of infection.  Check the skin around the cast every day. Tell your health care provider about any concerns.  You may put lotion on dry skin around the edges of the cast. Do not put lotion on the skin underneath the cast.  Keep the cast clean and dry. Bathing  Do not take baths, swim, or use a  hot tub until your health care provider approves. Ask your health care provider if you may take showers. You may only be allowed to take sponge baths.  If the cast, splint, or sling is not waterproof: ? Do not let it get wet. ? Cover it with a waterproof covering when you take a bath or shower. Managing pain, stiffness, and swelling   If directed, put ice on the injured area. ? If you have a removable splint or sling, remove it as told by your health care provider. ? Put ice in a plastic bag. ? Place a towel between your skin and the bag or  between your cast and the bag. ? Leave the ice on for 20 minutes, 2-3 times a day.  Move your fingers often to reduce stiffness and swelling.  Raise (elevate) the injured area above the level of your heart while you are sitting or lying down. Activity  Return to your normal activities as told by your health care provider. Ask your health care provider what activities are safe for you. You may not be able to lift anything with your arm for several weeks.  Do exercises as told by your health care provider or physical therapist. General instructions  Do not use any products that contain nicotine or tobacco, such as cigarettes, e-cigarettes, and chewing tobacco. These can delay bone healing. If you need help quitting, ask your health care provider.  Ask your health care provider when it is safe to drive if you have a cast, splint, or sling on your arm.  Keep all follow-up visits as told by your health care provider. This is important. Contact a health care provider if:  You have pain that gets worse.  Your hand and fingers swell.  Your cast or splint becomes loose or damaged. Get help right away if:  You lose feeling in your hand or fingers.  Your hand or fingers get cold or turn pale or blue. Summary  An olecranon fracture is a break in one of the bones of your elbow.  Olecranon fractures commonly occur after falling on a hard surface with a bent elbow.  Treatment depends on the severity and location of the fracture.  It is treated with ice, pain medicine, keeping the elbow still (immobilization), physical therapy, and surgery if needed. This information is not intended to replace advice given to you by your health care provider. Make sure you discuss any questions you have with your health care provider. Document Revised: 01/30/2018 Document Reviewed: 01/30/2018 Elsevier Patient Education  2020 [employer].

## 2019-04-07 ENCOUNTER — Encounter: Payer: Self-pay | Admitting: Physician Assistant

## 2019-04-27 ENCOUNTER — Other Ambulatory Visit: Payer: Self-pay | Admitting: Physician Assistant

## 2019-04-27 DIAGNOSIS — M25521 Pain in right elbow: Secondary | ICD-10-CM

## 2019-04-27 NOTE — Telephone Encounter (Signed)
Requested Prescriptions  Pending Prescriptions Disp Refills  . meloxicam (MOBIC) 15 MG tablet [Pharmacy Med Name: MELOXICAM 15 MG TABLET] 30 tablet 0    Sig: TAKE 1 TABLET BY MOUTH EVERY DAY     Analgesics:  COX2 Inhibitors Failed - 04/27/2019  9:55 AM      Failed - HGB in normal range and within 360 days    Hemoglobin  Date Value Ref Range Status  05/15/2017 17.8 (H) 13.0 - 17.7 g/dL Final         Failed - Cr in normal range and within 360 days    Creatinine, Ser  Date Value Ref Range Status  05/15/2017 0.93 0.76 - 1.27 mg/dL Final         Passed - Patient is not pregnant      Passed - Valid encounter within last 12 months    Recent Outpatient Visits          3 weeks ago Right elbow pain   [facility] [place], Lavella Hammock, [place]   2 months ago Subacute maxillary sinusitis   [facility], Jodell Cipro, [place]   11 months ago Tooth abscess   [facility], Alessandra Bevels, [place]   1 year ago Subacute maxillary sinusitis   [facility], Jodell Cipro, [place]   1 year ago Dizziness   [facility], [place], [place]

## 2019-07-08 ENCOUNTER — Encounter: Payer: Self-pay | Admitting: Adult Health

## 2019-07-08 ENCOUNTER — Ambulatory Visit (INDEPENDENT_AMBULATORY_CARE_PROVIDER_SITE_OTHER): Payer: BC Managed Care – PPO | Admitting: Adult Health

## 2019-07-08 VITALS — BP 131/76 | Temp 96.4°F | Wt 145.0 lb

## 2019-07-08 DIAGNOSIS — J0140 Acute pansinusitis, unspecified: Secondary | ICD-10-CM | POA: Diagnosis not present

## 2019-07-08 DIAGNOSIS — J301 Allergic rhinitis due to pollen: Secondary | ICD-10-CM | POA: Diagnosis not present

## 2019-07-08 DIAGNOSIS — R6889 Other general symptoms and signs: Secondary | ICD-10-CM

## 2019-07-08 MED ORDER — AMOXICILLIN-POT CLAVULANATE 875-125 MG PO TABS: 1.0000 | ORAL_TABLET | Freq: Two times a day (BID) | ORAL | 0 refills | Status: DC

## 2019-07-08 MED ORDER — FLUTICASONE PROPIONATE 50 MCG/ACT NA SUSP: 2.0000 | Freq: Every day | NASAL | 1 refills | Status: DC

## 2019-07-08 NOTE — Progress Notes (Signed)
MyChart Video Visit    Virtual Visit via Video Note   This visit type was conducted due to national recommendations for restrictions regarding the COVID-19 Pandemic (e.g. social distancing) in an effort to limit this patient's exposure and mitigate transmission in our community. This patient is at least at moderate risk for complications without adequate follow up. This format is felt to be most appropriate for this patient at this time. Physical exam was limited by quality of the video and audio technology used for the visit.   Patient location: at home  Provider location: Provider: Provider's office at  [facility], [place].      Patient: Kyle Clark   DOB: 1970/04/29   48 y.o. Male  MRN: [id number] Visit Date: 07/08/2019  Today's healthcare provider: Jairo Ben, FNP   No chief complaint on file.  Subjective    Sinus Problem This is a recurrent problem. The current episode started 1 to 4 weeks ago. The problem is unchanged. There has been no fever. Associated symptoms include congestion, coughing (reports as occasional cough without any chest congestion " feel it is all this post nasal drip" he states. ), headaches, sinus pressure and sneezing. Pertinent negatives include no chills, diaphoresis, ear pain, hoarse voice, neck pain, shortness of breath, sore throat or swollen glands. Treatments tried: zyrtec. The treatment provided mild relief.   He reports he has had sinus congestion for around 4 weeks, has worsened in the past two months.  He is still taking Zyrtec 10mg  daily that he started only about a week ago.  He has itchy, watery eyes as well- reports zyrtec is seemingly starting to help with that.   He reports he has had increased sinus pressure in  Maxillary area and in forehead/ nose worsening over the past 1- 2 weeks.  He has some green mucous discharge from nose with occasional blood streaks. Denies any nose bleeds.   He denies  any loss of smell or taste. Denies any covid exposure. He took Amoxicillin for sinusitis 02/2019 previously and reports it just never went away.  He is out of his Flonase.  Denies chest congestion. Denies any chest pain or chest pressure.    Denies any dizziness, lightheadedness, pre- syncope or syncope.  Patient  denies any fever, body aches,chills, rash, chest pain, shortness of breath, nausea, vomiting, or diarrhea.   He denies any other symptoms or concerns.   Allergies  Allergen Reactions  . Lisinopril Palpitations    Other reaction(s): Rapid pulse High blood pressure     Patient Active Problem List   Diagnosis Date Noted  . Hyperlipidemia 03/24/2015  . 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 Back pain with radiation 03/24/2015  . Allergic rhinitis 08/21/2009   Past Surgical History:  Procedure Laterality Date  . NO PAST SURGERIES     Allergies  Allergen Reactions  . Lisinopril Palpitations    Other reaction(s): Rapid pulse High blood pressure       Medications: Outpatient Medications Prior to Visit  Medication Sig  . fluticasone (FLONASE) 50 MCG/ACT nasal spray Place 2 sprays into both nostrils daily. (Patient not taking: Reported on 04/02/2019)  . meloxicam (MOBIC) 15 MG tablet TAKE 1 TABLET BY MOUTH EVERY DAY  . Probiotic CAPS Take by mouth.   No facility-administered medications prior to visit.    Last CBC Lab Results  Component Value Date   WBC 7.4 05/15/2017   HGB 17.8 (H) 05/15/2017   HCT  50.2 05/15/2017   MCV 87 05/15/2017   MCH 30.8 05/15/2017   RDW 12.7 05/15/2017   PLT 248 81/03/7508   Last metabolic panel Lab Results  Component Value Date   GLUCOSE 91 05/15/2017   NA 141 05/15/2017   K 4.6 05/15/2017   CL 102 05/15/2017   CO2 18 (L) 05/15/2017   BUN 12 05/15/2017   CREATININE 0.93 05/15/2017   GFRNONAA 98 05/15/2017   GFRAA 113 05/15/2017   CALCIUM 9.9 05/15/2017   PROT 7.4 05/15/2017   ALBUMIN 4.8 05/15/2017   LABGLOB 2.6  05/15/2017   AGRATIO 1.8 05/15/2017   BILITOT 0.4 05/15/2017   ALKPHOS 104 05/15/2017   AST 22 05/15/2017   ALT 32 05/15/2017      Review of Systems  Constitutional: Negative for activity change, appetite change, chills, diaphoresis, fatigue, fever and unexpected weight change.  HENT: Positive for congestion, postnasal drip, sinus pressure and sneezing. Negative for ear pain, hoarse voice, nosebleeds, sore throat and trouble swallowing.   Eyes: Positive for discharge (watery eyes - denies any injury. watery when out doors ) and itching. Negative for photophobia, pain, redness and visual disturbance.  Respiratory: Positive for cough (reports as occasional cough without any chest congestion " feel it is all this post nasal drip" he states. ). Negative for apnea, choking, chest tightness, shortness of breath, wheezing and stridor.   Cardiovascular: Negative.  Negative for chest pain, palpitations and leg swelling.  Gastrointestinal: Negative.   Genitourinary: Negative.   Musculoskeletal: Negative.  Negative for neck pain.  Neurological: Positive for headaches.  Psychiatric/Behavioral: Negative.     Last CBC Lab Results  Component Value Date   WBC 7.4 05/15/2017   HGB 17.8 (H) 05/15/2017   HCT 50.2 05/15/2017   MCV 87 05/15/2017   MCH 30.8 05/15/2017   RDW 12.7 05/15/2017   PLT 248 25/85/2778   Last metabolic panel Lab Results  Component Value Date   GLUCOSE 91 05/15/2017   NA 141 05/15/2017   K 4.6 05/15/2017   CL 102 05/15/2017   CO2 18 (L) 05/15/2017   BUN 12 05/15/2017   CREATININE 0.93 05/15/2017   GFRNONAA 98 05/15/2017   GFRAA 113 05/15/2017   CALCIUM 9.9 05/15/2017   PROT 7.4 05/15/2017   ALBUMIN 4.8 05/15/2017   LABGLOB 2.6 05/15/2017   AGRATIO 1.8 05/15/2017   BILITOT 0.4 05/15/2017   ALKPHOS 104 05/15/2017   AST 22 05/15/2017   ALT 32 05/15/2017      Objective    There were no vitals taken for this visit. BP Readings from Last 3 Encounters:  07/08/19  131/76  04/02/19 (!) 162/85  05/22/18 (!) 148/96    No vital signs available. He denies any respiratory issues or distress.   Physical Exam   Video visit. He appears to be in no acute distress.  Patient is alert and oriented and responsive to questions Engages in conversation with provider. Speaks in full sentences without any pauses without any shortness of breath or distress.     Assessment & Plan     Acute non-recurrent pansinusitis  Seasonal allergic rhinitis due to pollen  Itchy eyes - Plan: fluticasone (FLONASE) 50 MCG/ACT nasal spray   Meds ordered this encounter  Medications  . amoxicillin-clavulanate (AUGMENTIN) 875-125 MG tablet    Sig: Take 1 tablet by mouth 2 (two) times daily.    Dispense:  20 tablet    Refill:  0  . fluticasone (FLONASE) 50 MCG/ACT nasal spray  Sig: Place 2 sprays into both nostrils daily.    Dispense:  16 g    Refill:  1   Reminded him to start Zyrtec sooner in the season next time. Refilled his Flonase since he is out. He denies any glaucoma history.  Will treat for sinusitis given duration of symptoms.  Recommend covid testing. Allergy hygiene discussed changing pillow case, showering off when coming indoors.   RED flags discussed and when to seek care immediately.     Due for physical and labs with [facility], Jodell Cipro, [place] he is reminded to schedule this. Call if not improving and seek in person visit if any symptoms changing or worsening.   Advised patient call the office or your primary care doctor for an appointment if no improvement within 72 hours or if any symptoms change or worsen at any time  Advised ER or urgent Care if after hours or on weekend. Call 911 for emergency symptoms at any time.Patinet verbalized understanding of all instructions given/reviewed and treatment plan and has no further questions or concerns at this time.      I discussed the assessment and treatment plan with the patient. The patient was provided an  opportunity to ask questions and all were answered. The patient agreed with the plan and demonstrated an understanding of the instructions.   The patient was advised to call back or seek an in-person evaluation if the symptoms worsen or if the condition fails to improve as anticipated.  I provided 25  minutes of non-face-to-face time during this encounter.  IBeverely Pace Saamir Armstrong, FNP, have reviewed all documentation for this visit. The documentation on 07/08/19 for the exam, diagnosis, procedures, and orders are all accurate and complete.   The entirety of the information documented in the History of Present Illness, Review of Systems and Physical Exam were personally obtained by me. Portions of this information were initially documented by the  Certified Medical Assistant whose name is documented in Epic and reviewed by me for thoroughness and accuracy.  I have personally performed the exam and reviewed the chart and it is accurate to the best of my knowledge.  Museum/gallery conservator has been used and any errors in dictation or transcription are unintentional.  Eula Fried. Savien Mamula FNP-C  [facility]  Jairo Ben, FNP [facility] [phone] (phone) [phone] (fax)  [facility]

## 2019-07-08 NOTE — Patient Instructions (Signed)
Allergies, Adult An allergy means that your body reacts to something that bothers it (allergen). It is not a normal reaction. This can happen from something that you:  Eat.  Breathe in.  Touch. You can have an allergy (be allergic) to:  Outdoor things, like: ? Pollen. ? Grass. ? Weeds.  Indoor things, like: ? Dust. ? Smoke. ? Pet dander.  Foods.  Medicines.  Things that bother your skin, like: ? Detergents. ? Chemicals. ? Latex.  Perfume.  Bugs. An allergy cannot spread from person to person (is not contagious). Follow these instructions at home:         Stay away from things that you know you are allergic to.  If you have allergies to things in the air, wash out your nose each day. Do it with one of these: ? A salt-water (saline) spray. ? A container (neti pot).  Take over-the-counter and prescription medicines only as told by your doctor.  Keep all follow-up visits as told by your doctor. This is important.  If you are at risk for a very bad allergy reaction (anaphylaxis), keep an auto-injector with you all the time. This is called an epinephrine injection. ? This is pre-measured medicine with a needle. You can put it into your skin by yourself. ? Right after you have a very bad allergy reaction, you or a person with you must give the medicine in less than a few minutes. This is an emergency.  If you have ever had a very bad allergy reaction, wear a medical alert bracelet or necklace. Your very bad allergy should be written on it. Contact a health care provider if:  Your symptoms do not get better with treatment. Get help right away if:  You have symptoms of a very bad allergy reaction. These include: ? A swollen mouth, tongue, or throat. ? Pain or tightness in your chest. ? Trouble breathing. ? Being short of breath. ? Dizziness. ? Fainting. ? Very bad pain in your belly (abdomen). ? Throwing up (vomiting). ? Watery poop  (diarrhea). Summary  An allergy means that your body reacts to something that bothers it (allergen). It is not a normal reaction.  Stay away from things that make your body react.  Take over-the-counter and prescription medicines only as told by your doctor.  If you are at risk for a very bad allergy reaction, carry an auto-injector (epinephrine injection) all the time. Also, wear a medical alert bracelet or necklace so people know about your allergy. This information is not intended to replace advice given to you by your health care provider. Make sure you discuss any questions you have with your health care provider. Document Revised: 06/18/2018 Document Reviewed: 06/12/2016 Elsevier Patient Education  2020 Elsevier Inc. Sinusitis, Adult Sinusitis is soreness and swelling (inflammation) of your sinuses. Sinuses are hollow spaces in the bones around your face. They are located:  Around your eyes.  In the middle of your forehead.  Behind your nose.  In your cheekbones. Your sinuses and nasal passages are lined with a fluid called mucus. Mucus drains out of your sinuses. Swelling can trap mucus in your sinuses. This lets germs (bacteria, virus, or fungus) grow, which leads to infection. Most of the time, this condition is caused by a virus. What are the causes? This condition is caused by:  Allergies.  Asthma.  Germs.  Things that block your nose or sinuses.  Growths in the nose (nasal polyps).  Chemicals or irritants in the air.  Fungus (rare). What increases the risk? You are more likely to develop this condition if:  You have a weak body defense system (immune system).  You do a lot of swimming or diving.  You use nasal sprays too much.  You smoke. What are the signs or symptoms? The main symptoms of this condition are pain and a feeling of pressure around the sinuses. Other symptoms include:  Stuffy nose (congestion).  Runny nose (drainage).  Swelling and  warmth in the sinuses.  Headache.  Toothache.  A cough that may get worse at night.  Mucus that collects in the throat or the back of the nose (postnasal drip).  Being unable to smell and taste.  Being very tired (fatigue).  A fever.  Sore throat.  Bad breath. How is this diagnosed? This condition is diagnosed based on:  Your symptoms.  Your medical history.  A physical exam.  Tests to find out if your condition is short-term (acute) or long-term (chronic). Your doctor may: ? Check your nose for growths (polyps). ? Check your sinuses using a tool that has a light (endoscope). ? Check for allergies or germs. ? Do imaging tests, such as an MRI or CT scan. How is this treated? Treatment for this condition depends on the cause and whether it is short-term or long-term.  If caused by a virus, your symptoms should go away on their own within 10 days. You may be given medicines to relieve symptoms. They include: ? Medicines that shrink swollen tissue in the nose. ? Medicines that treat allergies (antihistamines). ? A spray that treats swelling of the nostrils. ? Rinses that help get rid of thick mucus in your nose (nasal saline washes).  If caused by bacteria, your doctor may wait to see if you will get better without treatment. You may be given antibiotic medicine if you have: ? A very bad infection. ? A weak body defense system.  If caused by growths in the nose, you may need to have surgery. Follow these instructions at home: Medicines  Take, use, or apply over-the-counter and prescription medicines only as told by your doctor. These may include nasal sprays.  If you were prescribed an antibiotic medicine, take it as told by your doctor. Do not stop taking the antibiotic even if you start to feel better. Hydrate and humidify   Drink enough water to keep your pee (urine) pale yellow.  Use a cool mist humidifier to keep the humidity level in your home above  50%.  Breathe in steam for 10-15 minutes, 3-4 times a day, or as told by your doctor. You can do this in the bathroom while a hot shower is running.  Try not to spend time in cool or dry air. Rest  Rest as much as you can.  Sleep with your head raised (elevated).  Make sure you get enough sleep each night. General instructions   Put a warm, moist washcloth on your face 3-4 times a day, or as often as told by your doctor. This will help with discomfort.  Wash your hands often with soap and water. If there is no soap and water, use hand sanitizer.  Do not smoke. Avoid being around people who are smoking (secondhand smoke).  Keep all follow-up visits as told by your doctor. This is important. Contact a doctor if:  You have a fever.  Your symptoms get worse.  Your symptoms do not get better within 10 days. Get help right away if:  You have a very bad headache.  You cannot stop throwing up (vomiting).  You have very bad pain or swelling around your face or eyes.  You have trouble seeing.  You feel confused.  Your neck is stiff.  You have trouble breathing. Summary  Sinusitis is swelling of your sinuses. Sinuses are hollow spaces in the bones around your face.  This condition is caused by tissues in your nose that become inflamed or swollen. This traps germs. These can lead to infection.  If you were prescribed an antibiotic medicine, take it as told by your doctor. Do not stop taking it even if you start to feel better.  Keep all follow-up visits as told by your doctor. This is important. This information is not intended to replace advice given to you by your health care provider. Make sure you discuss any questions you have with your health care provider. Document Revised: 07/30/2017 Document Reviewed: 07/30/2017 Elsevier Patient Education  [employer].

## 2019-07-29 ENCOUNTER — Encounter: Payer: Self-pay | Admitting: Adult Health

## 2019-07-30 ENCOUNTER — Telehealth (INDEPENDENT_AMBULATORY_CARE_PROVIDER_SITE_OTHER): Payer: BC Managed Care – PPO | Admitting: Physician Assistant

## 2019-07-30 ENCOUNTER — Encounter: Payer: Self-pay | Admitting: Physician Assistant

## 2019-07-30 VITALS — Wt 146.0 lb

## 2019-07-30 DIAGNOSIS — J301 Allergic rhinitis due to pollen: Secondary | ICD-10-CM | POA: Diagnosis not present

## 2019-07-30 DIAGNOSIS — J0140 Acute pansinusitis, unspecified: Secondary | ICD-10-CM | POA: Diagnosis not present

## 2019-07-30 MED ORDER — DOXYCYCLINE HYCLATE 100 MG PO TABS
100.0000 mg | ORAL_TABLET | Freq: Two times a day (BID) | ORAL | 0 refills | Status: AC
Start: 2019-07-30 — End: 2019-08-09

## 2019-07-30 MED ORDER — MONTELUKAST SODIUM 10 MG PO TABS: 10.0000 mg | ORAL_TABLET | Freq: Every day | ORAL | 0 refills | Status: DC

## 2019-07-30 NOTE — Progress Notes (Signed)
MyChart Video Visit    Virtual Visit via Video Note   This visit type was conducted due to national recommendations for restrictions regarding the COVID-19 Pandemic (e.g. social distancing) in an effort to limit this patient's exposure and mitigate transmission in our community. This patient is at least at moderate risk for complications without adequate follow up. This format is felt to be most appropriate for this patient at this time. Physical exam was limited by quality of the video and audio technology used for the visit.   Patient location: Home Provider location: Office    Patient: Kyle Clark   DOB: Feb 02, 1971   49 y.o. Male  MRN: [id number] Visit Date: 07/30/2019  Today's healthcare provider: Trinna Post, PA-C   No chief complaint on file.  Subjective    URI  This is a recurrent problem. The current episode started more than 1 month ago. The problem has been unchanged. There has been no fever. Associated symptoms include congestion, coughing, headaches, rhinorrhea, sinus pain (Left side), swollen glands and wheezing (only with the cough). Pertinent negatives include no chest pain, ear pain, nausea, plugged ear sensation or sore throat. He has tried antihistamine (Flonase and allergy medicine daily and took antibiotic a month ago) for the symptoms. The treatment provided no relief.   Patient treated for sinusitis on 07/08/2019 with 10 days of Augmentin which reports helped him feel better but after coming off it he reports a relapse of symptoms. He is reporting left sided facial pain. Reports significant PND. Denies fevers, chills, and shortness of breath. Currently taking zyrtec and flonase daily.     Medications: Outpatient Medications Prior to Visit  Medication Sig  . cetirizine (ZYRTEC) 10 MG tablet Take 10 mg by mouth daily.  . fluticasone (FLONASE) 50 MCG/ACT nasal spray Place 2 sprays into both nostrils daily.  Marland Kitchen amoxicillin-clavulanate (AUGMENTIN)  875-125 MG tablet Take 1 tablet by mouth 2 (two) times daily.  . meloxicam (MOBIC) 15 MG tablet TAKE 1 TABLET BY MOUTH EVERY DAY (Patient not taking: Reported on 07/08/2019)  . Probiotic CAPS Take by mouth.   No facility-administered medications prior to visit.    Review of Systems  Constitutional: Negative for chills and fever.  HENT: Positive for congestion, postnasal drip, rhinorrhea, sinus pressure and sinus pain (Left side). Negative for ear pain and sore throat.   Respiratory: Positive for cough and wheezing (only with the cough). Negative for chest tightness and shortness of breath.   Cardiovascular: Negative for chest pain, palpitations and leg swelling.  Gastrointestinal: Negative for nausea.  Neurological: Positive for headaches.      Objective    Wt 146 lb (66.2 kg)   BMI 22.20 kg/m    Physical Exam Constitutional:      Appearance: Normal appearance.  Pulmonary:     Effort: Pulmonary effort is normal. No respiratory distress.  Neurological:     Mental Status: He is alert.  Psychiatric:        Mood and Affect: Mood normal.        Behavior: Behavior normal.        Assessment & Plan    1. Acute non-recurrent pansinusitis  Treat with doxycycline as below. Start singulair to better control allergies, this is in addition to zyrtec and claritin.   - montelukast (SINGULAIR) 10 MG tablet; Take 1 tablet (10 mg total) by mouth at bedtime.  Dispense: 90 tablet; Refill: 0 - doxycycline (VIBRA-TABS) 100 MG tablet; Take 1 tablet (100 mg  total) by mouth 2 (two) times daily for 10 days.  Dispense: 20 tablet; Refill: 0  2. Allergic rhinitis due to pollen, unspecified seasonality  - montelukast (SINGULAIR) 10 MG tablet; Take 1 tablet (10 mg total) by mouth at bedtime.  Dispense: 90 tablet; Refill: 0    No follow-ups on file.     I discussed the assessment and treatment plan with the patient. The patient was provided an opportunity to ask questions and all were answered.  The patient agreed with the plan and demonstrated an understanding of the instructions.   The patient was advised to call back or seek an in-person evaluation if the symptoms worsen or if the condition fails to improve as anticipated.  I provided 25 minutes of non-face-to-face time during this encounter.  ITrey Sailors, PA-C, have reviewed all documentation for this visit. The documentation on 07/30/19 for the exam, diagnosis, procedures, and orders are all accurate and complete.   Maryella Shivers [facility] [phone] (phone) [phone] (fax)  [facility]

## 2019-10-22 ENCOUNTER — Other Ambulatory Visit: Payer: Self-pay | Admitting: Physician Assistant

## 2019-10-22 DIAGNOSIS — J0140 Acute pansinusitis, unspecified: Secondary | ICD-10-CM

## 2019-10-22 DIAGNOSIS — J301 Allergic rhinitis due to pollen: Secondary | ICD-10-CM

## 2019-10-22 NOTE — Telephone Encounter (Signed)
Requested Prescriptions  Pending Prescriptions Disp Refills   montelukast (SINGULAIR) 10 MG tablet [Pharmacy Med Name: MONTELUKAST SOD 10 MG TABLET] 90 tablet 0    Sig: TAKE 1 TABLET BY MOUTH EVERYDAY AT BEDTIME     Pulmonology:  Leukotriene Inhibitors Passed - 10/22/2019  2:05 AM      Passed - Valid encounter within last 12 months    Recent Outpatient Visits          2 months ago Acute non-recurrent pansinusitis   [facility] [place], Ricki Rodriguez M, PA-C   3 months ago Acute non-recurrent pansinusitis   [facility] [place], Eula Fried, FNP   6 months ago Right elbow pain   [facility] Osvaldo Angst M, [place]   8 months ago Subacute maxillary sinusitis   [facility] Chrismon, Jodell Cipro, [place]   1 year ago Tooth abscess   [facility] [place], [place], [place]

## 2019-11-18 ENCOUNTER — Other Ambulatory Visit: Payer: BC Managed Care – PPO

## 2019-11-18 ENCOUNTER — Other Ambulatory Visit: Payer: Self-pay

## 2019-11-18 DIAGNOSIS — Z20822 Contact with and (suspected) exposure to covid-19: Secondary | ICD-10-CM

## 2019-11-24 ENCOUNTER — Telehealth: Payer: Self-pay | Admitting: *Deleted

## 2019-11-24 LAB — NOVEL CORONAVIRUS, NAA: SARS-CoV-2, NAA: NOT DETECTED

## 2019-11-24 NOTE — Telephone Encounter (Signed)
Patient is calling to check on his COVID test result- lab order 9/7. Advised I would contact lab and see if I can find out why his test has not resulted yet. Call to [employer] : Customer service- call to lab- test was re run- and should result today/tomorrow. Patient notified.

## 2019-11-25 ENCOUNTER — Other Ambulatory Visit: Payer: BC Managed Care – PPO

## 2019-11-25 ENCOUNTER — Other Ambulatory Visit: Payer: Self-pay | Admitting: Critical Care Medicine

## 2019-11-25 DIAGNOSIS — Z20822 Contact with and (suspected) exposure to covid-19: Secondary | ICD-10-CM

## 2019-11-27 LAB — NOVEL CORONAVIRUS, NAA: SARS-CoV-2, NAA: NOT DETECTED

## 2019-11-27 LAB — SARS-COV-2, NAA 2 DAY TAT

## 2019-12-02 ENCOUNTER — Other Ambulatory Visit: Payer: BC Managed Care – PPO

## 2019-12-02 DIAGNOSIS — Z20822 Contact with and (suspected) exposure to covid-19: Secondary | ICD-10-CM

## 2019-12-04 LAB — NOVEL CORONAVIRUS, NAA: SARS-CoV-2, NAA: NOT DETECTED

## 2019-12-04 LAB — SARS-COV-2, NAA 2 DAY TAT

## 2019-12-09 ENCOUNTER — Other Ambulatory Visit: Payer: BC Managed Care – PPO

## 2019-12-09 DIAGNOSIS — Z20822 Contact with and (suspected) exposure to covid-19: Secondary | ICD-10-CM

## 2019-12-10 LAB — NOVEL CORONAVIRUS, NAA: SARS-CoV-2, NAA: NOT DETECTED

## 2019-12-10 LAB — SARS-COV-2, NAA 2 DAY TAT

## 2019-12-16 ENCOUNTER — Other Ambulatory Visit: Payer: BC Managed Care – PPO

## 2019-12-16 DIAGNOSIS — Z20822 Contact with and (suspected) exposure to covid-19: Secondary | ICD-10-CM

## 2019-12-17 LAB — SARS-COV-2, NAA 2 DAY TAT

## 2019-12-17 LAB — NOVEL CORONAVIRUS, NAA: SARS-CoV-2, NAA: NOT DETECTED

## 2019-12-23 ENCOUNTER — Other Ambulatory Visit: Payer: BC Managed Care – PPO

## 2019-12-30 ENCOUNTER — Encounter: Payer: Self-pay | Admitting: Family Medicine

## 2020-02-03 ENCOUNTER — Encounter: Payer: Self-pay | Admitting: Adult Health

## 2020-02-03 ENCOUNTER — Telehealth: Payer: Self-pay

## 2020-02-03 ENCOUNTER — Telehealth: Payer: BC Managed Care – PPO | Admitting: Adult Health

## 2020-02-03 VITALS — Wt 146.0 lb

## 2020-02-03 DIAGNOSIS — R059 Cough, unspecified: Secondary | ICD-10-CM | POA: Insufficient documentation

## 2020-02-03 DIAGNOSIS — J329 Chronic sinusitis, unspecified: Secondary | ICD-10-CM

## 2020-02-03 DIAGNOSIS — R062 Wheezing: Secondary | ICD-10-CM | POA: Diagnosis not present

## 2020-02-03 DIAGNOSIS — J3489 Other specified disorders of nose and nasal sinuses: Secondary | ICD-10-CM | POA: Insufficient documentation

## 2020-02-03 DIAGNOSIS — J324 Chronic pansinusitis: Secondary | ICD-10-CM

## 2020-02-03 DIAGNOSIS — J309 Allergic rhinitis, unspecified: Secondary | ICD-10-CM | POA: Diagnosis not present

## 2020-02-03 MED ORDER — BENZONATATE 100 MG PO CAPS: 100.0000 mg | ORAL_CAPSULE | Freq: Two times a day (BID) | ORAL | 0 refills | Status: DC | PRN

## 2020-02-03 MED ORDER — AZELASTINE HCL 0.15 % NA SOLN: 1.0000 | Freq: Two times a day (BID) | NASAL | 1 refills | Status: DC

## 2020-02-03 MED ORDER — LEVOCETIRIZINE DIHYDROCHLORIDE 5 MG PO TABS: 5.0000 mg | ORAL_TABLET | Freq: Every evening | ORAL | 0 refills | Status: DC

## 2020-02-03 MED ORDER — ALBUTEROL SULFATE HFA 108 (90 BASE) MCG/ACT IN AERS: 1.0000 | INHALATION_SPRAY | Freq: Four times a day (QID) | RESPIRATORY_TRACT | 0 refills | Status: DC | PRN

## 2020-02-03 MED ORDER — AMOXICILLIN-POT CLAVULANATE 875-125 MG PO TABS: 1.0000 | ORAL_TABLET | Freq: Two times a day (BID) | ORAL | 0 refills | Status: DC

## 2020-02-03 MED ORDER — PREDNISONE 10 MG (21) PO TBPK: ORAL_TABLET | ORAL | 0 refills | Status: DC

## 2020-02-03 NOTE — Progress Notes (Signed)
MyChart Video Visit    Virtual Visit via Video Note   This visit type was conducted due to national recommendations for restrictions regarding the COVID-19 Pandemic (e.g. social distancing) in an effort to limit this patient's exposure and mitigate transmission in our community. This patient is at least at moderate risk for complications without adequate follow up. This format is felt to be most appropriate for this patient at this time. Physical exam was limited by quality of the video and audio technology used for the visit.   Parties involved in visit.:  Patient location: at home  Provider location:Provider: Provider's office at  [facility], [place].     I discussed the limitations of evaluation and management by telemedicine and the availability of in person appointments. The patient expressed understanding and agreed to proceed.  Patient: Kyle Clark   DOB: 01/26/1971   49 y.o. Male  MRN: [id number] Visit Date: 02/03/2020  Today's healthcare provider: Jairo Ben, FNP   Chief Complaint  Patient presents with  . URI   Subjective    HPI  Upper respiratory symptoms(recurrent) He complains of bilateral ear pressure/pain, congestion, cough described as productive, facial pain, nasal congestion, post nasal drip, productive cough with  green colored sputum, purulent nasal discharge, shortness of breath, sinus pressure and tooth pain.with no fever, chills, night sweats or weight loss. Onset of symptoms was about a year ago and staying constant.He is drinking plenty of fluids.  Past history is significant for no history of pneumonia or bronchitis. Patient is smoker  (.5 ppd x 25+ yrs)  Sinus pressure, ear pressure. Onset 3 weeks ago.  Mild chest congestion. Denies any chest pain. Post nasal congestion.   Oxygen saturation 98 %. Heart rate ok.  Covid test is negative.  He takes Zyrtec 10 mg qd here and there. He use to take Singulair.  He  reports he has a history of recurrent sinusitis, nasal allergies.   Patient  denies any fever, body aches,chills, rash, chest pain, shortness of breath, nausea, vomiting, or diarrhea.  Denies dizziness, lightheadedness, pre syncopal or syncopal episodes.   ---------------------------------------------------------------------------------------------------    Patient Active Problem List   Diagnosis Date Noted  . Pansinusitis 02/03/2020  . Chronic sinusitis 02/03/2020  . Cough 02/03/2020  . Itchy eyes 07/08/2019  . Acute non-recurrent pansinusitis 07/08/2019  . Hyperlipidemia 03/24/2015  . 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 Back pain with radiation 03/24/2015  . Allergic rhinitis 08/21/2009   Social History   Tobacco Use  . Smoking status: Current Every Day Smoker    Packs/day: 0.50    Years: 25.00    Pack years: 12.50    Types: Cigarettes  . Smokeless tobacco: Never Used  Substance Use Topics  . Alcohol use: Yes    Comment: occasional alcohol use, drinks beer on weekends maybe once a week  . Drug use: No   Allergies  Allergen Reactions  . Lisinopril Palpitations    Other reaction(s): Rapid pulse High blood pressure       Medications: Outpatient Medications Prior to Visit  Medication Sig  . fluticasone (FLONASE) 50 MCG/ACT nasal spray Place 2 sprays into both nostrils daily.  . [DISCONTINUED] cetirizine (ZYRTEC) 10 MG tablet Take 10 mg by mouth daily.  . [DISCONTINUED] Probiotic CAPS Take by mouth.  . [DISCONTINUED] meloxicam (MOBIC) 15 MG tablet TAKE 1 TABLET BY MOUTH EVERY DAY (Patient not taking: Reported on 07/08/2019)  . [DISCONTINUED] montelukast (SINGULAIR) 10 MG tablet  TAKE 1 TABLET BY MOUTH EVERYDAY AT BEDTIME (Patient not taking: Reported on 02/03/2020)   No facility-administered medications prior to visit.    Review of Systems  Constitutional: Negative for appetite change, chills and fever.  HENT: Positive for congestion, ear pain,  postnasal drip, rhinorrhea, sinus pressure, sinus pain and sore throat.   Respiratory: Positive for cough, shortness of breath and wheezing.   Cardiovascular: Negative for chest pain and palpitations.  Neurological: Positive for headaches.    Last CBC Lab Results  Component Value Date   WBC 7.4 05/15/2017   HGB 17.8 (H) 05/15/2017   HCT 50.2 05/15/2017   MCV 87 05/15/2017   MCH 30.8 05/15/2017   RDW 12.7 05/15/2017   PLT 248 05/15/2017      Objective    Wt 146 lb (66.2 kg)   BMI 22.20 kg/m  BP Readings from Last 3 Encounters:  07/08/19 131/76  04/02/19 (!) 162/85  05/22/18 (!) 148/96   Wt Readings from Last 3 Encounters:  02/03/20 146 lb (66.2 kg)  07/30/19 146 lb (66.2 kg)  07/08/19 145 lb (65.8 kg)      Physical Exam    Patient is alert and oriented and responsive to questions Engages in conversation with provider. Speaks in full sentences without any pauses without any shortness of breath or distress.   Assessment & Plan     Wheezing - Plan: albuterol (VENTOLIN HFA) 108 (90 Base) MCG/ACT inhaler  Chronic pansinusitis - Plan: CBC with Differential/Platelet, Comprehensive Metabolic Panel (CMET), amoxicillin-clavulanate (AUGMENTIN) 875-125 MG tablet, TSH, DG Chest 2 View, predniSONE (STERAPRED UNI-PAK 21 TAB) 10 MG (21) TBPK tablet, levocetirizine (XYZAL) 5 MG tablet, CT Maxillofacial WO CM, Ambulatory referral to ENT  Chronic sinusitis, unspecified location - Plan: Ambulatory referral to ENT  Allergic rhinitis, unspecified seasonality, unspecified trigger - Plan: Ambulatory referral to ENT  Cough - Plan: Ambulatory referral to ENT  Sinus pain - Plan: Ambulatory referral to ENT  Orders Placed This Encounter  Procedures  . DG Chest 2 View  . CT Maxillofacial WO CM  . CBC with Differential/Platelet  . Comprehensive Metabolic Panel (CMET)  . TSH  . Ambulatory referral to ENT   Should hear from ENT within one week. Call if not heard.   Red Flags discussed.  The patient was given clear instructions to go to ER or return to medical center if any red flags develop, symptoms do not improve, worsen or new problems develop. They verbalized understanding.   Return in about 1 week (around 02/10/2020), or if symptoms worsen or fail to improve, for at any time for any worsening symptoms, Go to Emergency room/ urgent care if worse.     I discussed the limitations of evaluation and management by telemedicine and the availability of in person appointments. The patient expressed understanding and agreed to proceed.  I discussed the assessment and treatment plan with the patient. The patient was provided an opportunity to ask questions and all were answered. The patient agreed with the plan and demonstrated an understanding of the instructions.   The patient was advised to call back or seek an in-person evaluation if the symptoms worsen or if the condition fails to improve as anticipated.  I provided 45 minutes of non-face-to-face time during this encounter.   Jairo Ben, FNP [facility] [phone] (phone) [phone] (fax)  [facility]

## 2020-02-03 NOTE — Patient Instructions (Signed)
Azelastine nasal spray What is this medicine? AZELASTINE (a ZEL as teen) nasal spray is a histamine blocker. It helps to relieve itching, running and stuffiness in the nose. This medicine is used to treat nasal symptoms from allergies and other irritants. This medicine may be used for other purposes; ask your health care provider or pharmacist if you have questions. COMMON BRAND NAME(S): Astelin, Astepro What should I tell my health care provider before I take this medicine? They need to know if you have any of these conditions:  any other medical problems  an unusual or allergic reaction to azelastine, other nasal sprays, medicines, foods, dyes, or preservatives  pregnant or trying to become pregnant  breast-feeding How should I use this medicine? This medicine is for use only in the nose. Follow the directions on the prescription label. Do not use more often than directed. Make sure that you are using your inhaler correctly. Ask you doctor or health care provider if you have any questions. Talk to your pediatrician regarding the use of this medicine in children. While some products may be prescribed for children as young as 6 months for selected conditions, precautions do apply. Overdosage: If you think you have taken too much of this medicine contact a poison control center or emergency room at once. NOTE: This medicine is only for you. Do not share this medicine with others. What if I miss a dose? If you miss a dose, use it as soon as you can. If it is almost time for your next dose, use only that dose. Do not use double or extra doses. What may interact with this medicine?  cimetidine  other antihistamines This list may not describe all possible interactions. Give your health care provider a list of all the medicines, herbs, non-prescription drugs, or dietary supplements you use. Also tell them if you smoke, drink alcohol, or use illegal drugs. Some items may interact with your  medicine. What should I watch for while using this medicine? Tell your doctor or health care professional if your symptoms do not improve. Do not use extra medicine. Do not spray in your eyes. This medicine may make you feel confused, dizzy or lightheaded. Drinking alcohol or taking medicine that causes drowsiness can make this worse. Do not drive, use machinery, or do anything that needs mental alertness until you know how this medicine affects you. What side effects may I notice from receiving this medicine? Side effects that you should report to your doctor or health care professional as soon as possible:  allergic reactions like skin rash, itching or hives, swelling of the face, lips, or tongue  breathing problems  fast heartbeat  high blood pressure  infection Side effects that usually do not require medical attention (report to your doctor or health care professional if they continue or are bothersome):  bitter taste  cough  feeling tired  headache  larger appetite or weight gain  nose or throat irritation  nosebleed  sneezing This list may not describe all possible side effects. Call your doctor for medical advice about side effects. You may report side effects to FDA at 1-800-FDA-1088. Where should I keep my medicine? Keep out of the reach of children. Store upright and tightly closed at room temperature between 20 and 25 degrees C (68 and 77 degrees F). Do not freeze. Throw away any unused medicine after the expiration date or after 200 sprays, whichever comes first. NOTE: This sheet is a summary. It may not cover all  possible information. If you have questions about this medicine, talk to your doctor, pharmacist, or health care provider.  2020 Elsevier/Gold Standard (2013-05-06 15:52:44) Amoxicillin; Clavulanic Acid Tablets What is this medicine? AMOXICILLIN; CLAVULANIC ACID (a mox i SIL in; KLAV yoo lan ic AS id) is a penicillin antibiotic. It treats some  infections caused by bacteria. It will not work for colds, the flu, or other viruses. This medicine may be used for other purposes; ask your health care provider or pharmacist if you have questions. COMMON BRAND NAME(S): Augmentin What should I tell my health care provider before I take this medicine? They need to know if you have any of these conditions:  bowel disease, like colitis  kidney disease  liver disease  mononucleosis  an unusual or allergic reaction to amoxicillin, penicillin, cephalosporin, other antibiotics, clavulanic acid, other medicines, foods, dyes, or preservatives  pregnant or trying to get pregnant  breast-feeding How should I use this medicine? Take this drug by mouth. Take it as directed on the prescription label at the same time every day. Take it with food at the start of a meal or snack. Take all of this drug unless your health care provider tells you to stop it early. Keep taking it even if you think you are better. Talk to your health care provider about the use of this drug in children. While it may be prescribed for selected conditions, precautions do apply. Overdosage: If you think you have taken too much of this medicine contact a poison control center or emergency room at once. NOTE: This medicine is only for you. Do not share this medicine with others. What if I miss a dose? If you miss a dose, take it as soon as you can. If it is almost time for your next dose, take only that dose. Do not take double or extra doses. What may interact with this medicine?  allopurinol  anticoagulants  birth control pills  methotrexate  probenecid This list may not describe all possible interactions. Give your health care provider a list of all the medicines, herbs, non-prescription drugs, or dietary supplements you use. Also tell them if you smoke, drink alcohol, or use illegal drugs. Some items may interact with your medicine. What should I watch for while  using this medicine? Tell your doctor or healthcare provider if your symptoms do not improve. This medicine may cause serious skin reactions. They can happen weeks to months after starting the medicine. Contact your healthcare provider right away if you notice fevers or flu-like symptoms with a rash. The rash may be red or purple and then turn into blisters or peeling of the skin. Or, you might notice a red rash with swelling of the face, lips or lymph nodes in your neck or under your arms. Do not treat diarrhea with over the counter products. Contact your doctor if you have diarrhea that lasts more than 2 days or if it is severe and watery. If you have diabetes, you may get a false-positive result for sugar in your urine. Check with your doctor or healthcare provider. Birth control pills may not work properly while you are taking this medicine. Talk to your doctor about using an extra method of birth control. What side effects may I notice from receiving this medicine? Side effects that you should report to your doctor or health care professional as soon as possible:  allergic reactions like skin rash, itching or hives, swelling of the face, lips, or tongue  breathing  problems  dark urine  fever or chills, sore throat  redness, blistering, peeling, or loosening of the skin, including inside the mouth  seizures  trouble passing urine or change in the amount of urine  unusual bleeding, bruising  unusually weak or tired  white patches or sores in the mouth or throat Side effects that usually do not require medical attention (report to your doctor or health care professional if they continue or are bothersome):  diarrhea  dizziness  headache  nausea, vomiting  stomach upset  vaginal or anal irritation This list may not describe all possible side effects. Call your doctor for medical advice about side effects. You may report side effects to FDA at 1-800-FDA-1088. Where should I  keep my medicine? Keep out of the reach of children and pets. Store at room temperature between 20 and 25 degrees C (68 and 77 degrees F). Throw away any unused drug after the expiration date. NOTE: This sheet is a summary. It may not cover all possible information. If you have questions about this medicine, talk to your doctor, pharmacist, or health care provider.  2020 Elsevier/Gold Standard (2018-09-30 11:55:53) Benzonatate capsules What is this medicine? BENZONATATE (ben ZOE na tate) is used to treat cough. This medicine may be used for other purposes; ask your health care provider or pharmacist if you have questions. COMMON BRAND NAME(S): Tessalon Perles, Zonatuss What should I tell my health care provider before I take this medicine? They need to know if you have any of these conditions:  kidney or liver disease  an unusual or allergic reaction to benzonatate, anesthetics, other medicines, foods, dyes, or preservatives  pregnant or trying to get pregnant  breast-feeding How should I use this medicine? Take this medicine by mouth with a glass of water. Follow the directions on the prescription label. Avoid breaking, chewing, or sucking the capsule, as this can cause serious side effects. Take your medicine at regular intervals. Do not take your medicine more often than directed. Talk to your pediatrician regarding the use of this medicine in children. While this drug may be prescribed for children as young as 82 years old for selected conditions, precautions do apply. Overdosage: If you think you have taken too much of this medicine contact a poison control center or emergency room at once. NOTE: This medicine is only for you. Do not share this medicine with others. What if I miss a dose? If you miss a dose, take it as soon as you can. If it is almost time for your next dose, take only that dose. Do not take double or extra doses. What may interact with this medicine? Do not take  this medicine with any of the following medications:  MAOIs like Carbex, Eldepryl, Marplan, Nardil, and Parnate This list may not describe all possible interactions. Give your health care provider a list of all the medicines, herbs, non-prescription drugs, or dietary supplements you use. Also tell them if you smoke, drink alcohol, or use illegal drugs. Some items may interact with your medicine. What should I watch for while using this medicine? Tell your doctor if your symptoms do not improve or if they get worse. If you have a high fever, skin rash, or headache, see your health care professional. You may get drowsy or dizzy. Do not drive, use machinery, or do anything that needs mental alertness until you know how this medicine affects you. Do not sit or stand up quickly, especially if you are an older patient.  This reduces the risk of dizzy or fainting spells. What side effects may I notice from receiving this medicine? Side effects that you should report to your doctor or health care professional as soon as possible:  allergic reactions like skin rash, itching or hives, swelling of the face, lips, or tongue  breathing problems  chest pain  confusion or hallucinations  irregular heartbeat  numbness of mouth or throat  seizures Side effects that usually do not require medical attention (report to your doctor or health care professional if they continue or are bothersome):  burning feeling in the eyes  constipation  headache  nasal congestion  stomach upset This list may not describe all possible side effects. Call your doctor for medical advice about side effects. You may report side effects to FDA at 1-800-FDA-1088. Where should I keep my medicine? Keep out of the reach of children. Store at room temperature between 15 and 30 degrees C (59 and 86 degrees F). Keep tightly closed. Protect from light and moisture. Throw away any unused medicine after the expiration date. NOTE:  This sheet is a summary. It may not cover all possible information. If you have questions about this medicine, talk to your doctor, pharmacist, or health care provider.  2020 Elsevier/Gold Standard (2007-05-29 14:52:56) Albuterol inhalation aerosol What is this medicine? ALBUTEROL (al Gaspar Bidding) is a bronchodilator. It helps open up the airways in your lungs to make it easier to breathe. This medicine is used to treat and to prevent bronchospasm. This medicine may be used for other purposes; ask your health care provider or pharmacist if you have questions. COMMON BRAND NAME(S): Proair HFA, Proventil, Proventil HFA, Respirol, Ventolin, Ventolin HFA What should I tell my health care provider before I take this medicine? They need to know if you have any of the following conditions:  diabetes  heart disease or irregular heartbeat  high blood pressure  pheochromocytoma  seizures  thyroid disease  an unusual or allergic reaction to albuterol, levalbuterol, other medicines, foods, dyes, or preservatives  pregnant or trying to get pregnant  breast-feeding How should I use this medicine? This medicine is for inhalation through the mouth. Follow the directions on your prescription label. Take your medicine at regular intervals. Do not use more often than directed. Make sure that you are using your inhaler correctly. Ask your doctor or health care provider if you have any questions. Talk to your pediatrician regarding the use of this medicine in children. While this drug may be prescribed for children as young as 4 years for selected conditions, precautions do apply. Overdosage: If you think you have taken too much of this medicine contact a poison control center or emergency room at once. NOTE: This medicine is only for you. Do not share this medicine with others. What if I miss a dose? If you miss a dose, use it as soon as you can. If it is almost time for your next dose, use only that  dose. Do not use double or extra doses. What may interact with this medicine?  anti-infectives like chloroquine and pentamidine  caffeine  cisapride  diuretics  medicines for colds  medicines for depression or for emotional or psychotic conditions  medicines for weight loss including some herbal products  methadone  some antibiotics like clarithromycin, erythromycin, levofloxacin, and linezolid  some heart medicines  steroid hormones like dexamethasone, cortisone, hydrocortisone  theophylline  thyroid hormones This list may not describe all possible interactions. Give your health care  provider a list of all the medicines, herbs, non-prescription drugs, or dietary supplements you use. Also tell them if you smoke, drink alcohol, or use illegal drugs. Some items may interact with your medicine. What should I watch for while using this medicine? Tell your doctor or health care professional if your symptoms do not improve. Do not use extra albuterol. If your asthma or bronchitis gets worse while you are using this medicine, call your doctor right away. If your mouth gets dry try chewing sugarless gum or sucking hard candy. Drink water as directed. What side effects may I notice from receiving this medicine? Side effects that you should report to your doctor or health care professional as soon as possible:  allergic reactions like skin rash, itching or hives, swelling of the face, lips, or tongue  breathing problems  chest pain  feeling faint or lightheaded, falls  high blood pressure  irregular heartbeat  fever  muscle cramps or weakness  pain, tingling, numbness in the hands or feet  vomiting Side effects that usually do not require medical attention (report to your doctor or health care professional if they continue or are bothersome):  changes in taste  cough  dry mouth  headache  nervousness or trembling  stomach upset  stuffy or runny nose  throat  irritation  trouble sleeping This list may not describe all possible side effects. Call your doctor for medical advice about side effects. You may report side effects to FDA at 1-800-FDA-1088. Where should I keep my medicine? Keep out of the reach of children. Store Proventil HFA and ProAir HFA at room temperature between 15 and 25 degrees C (59 and 77 degrees F). Store Ventolin HFA at room temperature between 20 and 25 degrees C (68 and 77 degrees F); it may be stored between 15 and 30 degrees C (59 and 86 degrees F) on occasion. The contents are under pressure and may burst when exposed to heat or flame. Do not freeze. This medicine does not work as well if it is too cold. Throw away the inhaler when the dose counter displays "0" or after the expiration date on the package, whichever comes first. Ventolin HFA should be thrown away 12 months after removing it from the foil pouch. NOTE: This sheet is a summary. It may not cover all possible information. If you have questions about this medicine, talk to your doctor, pharmacist, or health care provider.  2020 Elsevier/Gold Standard (2018-06-13 12:46:54) Levocetirizine Oral Tablets What is this medicine? LEVOCETIRIZINE (LEE voe se TIR i zeen) is an antihistamine. This medicine is used to treat or prevent symptoms of allergies. It is also used to help reduce itchy skin rash and hives. This medicine may be used for other purposes; ask your health care provider or pharmacist if you have questions. COMMON BRAND NAME(S): Xyzal, Xyzal Allergy 24 Hour What should I tell my health care provider before I take this medicine? They need to know if you have any of these conditions:  kidney disease  an unusual or allergic reaction to levocetirizine, cetirizine, hydroxyzine, other medicines, foods, dyes, or preservatives  pregnant or trying to get pregnant  breast-feeding How should I use this medicine? Take this medicine by mouth with a glass of water.  Take it at night. The tablet may be split in half. Do not chew the tablets. Follow the directions on the prescription label. You can take it with or without food. Do not take more medicine than directed. You may need  to take this medicine for several days before your symptoms improve. Talk to your pediatrician regarding the use of this medicine in children. While this drug may be prescribed for children as young as 83 years old for selected conditions, precautions do apply. Overdosage: If you think you have taken too much of this medicine contact a poison control center or emergency room at once. NOTE: This medicine is only for you. Do not share this medicine with others. What if I miss a dose? If you miss a dose, take it as soon as you can. If it is almost time for your next dose, take only that dose. Do not take double or extra doses. What may interact with this medicine?  alcohol  MAOIs like Carbex, Eldepryl, Marplan, Nardil, and Parnate  medicines that cause drowsiness or sleep  other medicines for colds or allergies  ritonavir  theophylline This list may not describe all possible interactions. Give your health care provider a list of all the medicines, herbs, non-prescription drugs, or dietary supplements you use. Also tell them if you smoke, drink alcohol, or use illegal drugs. Some items may interact with your medicine. What should I watch for while using this medicine? Visit your doctor or health care professional for regular checks on your health. Tell your doctor or healthcare professional if your symptoms do not start to get better or if they get worse. You may get drowsy or dizzy. Do not drive, use machinery, or do anything that needs mental alertness until you know how this medicine affects you. Do not stand or sit up quickly, especially if you are an older patient. This reduces the risk of dizzy or fainting spells. Alcohol may interfere with the effect of this medicine. Avoid  alcoholic drinks. Your mouth may get dry. Chewing sugarless gum or sucking hard candy, and drinking plenty of water may help. Contact your doctor if the problem does not go away or is severe. What side effects may I notice from receiving this medicine? Side effects that you should report to your doctor or health care professional as soon as possible:  allergic reactions like skin rash, itching or hives, swelling of the face, lips, or tongue  changes in vision or hearing  fever  trouble passing urine or change in the amount of urine Side effects that usually do not require medical attention (report to your doctor or health care professional if they continue or are bothersome):  cough  dizziness  drowsiness or tiredness  dry mouth  muscle aches This list may not describe all possible side effects. Call your doctor for medical advice about side effects. You may report side effects to FDA at 1-800-FDA-1088. Where should I keep my medicine? Keep out of the reach of children. Store at room temperature between 15 and 30 degrees C (59 and 86 degrees F). Throw away any unused medicine after the expiration date. NOTE: This sheet is a summary. It may not cover all possible information. If you have questions about this medicine, talk to your doctor, pharmacist, or health care provider.  2020 Elsevier/Gold Standard (2007-11-20 11:17:47) Allergies, Adult An allergy means that your body reacts to something that bothers it (allergen). It is not a normal reaction. This can happen from something that you:  Eat.  Breathe in.  Touch. You can have an allergy (be allergic) to:  Outdoor things, like: ? Pollen. ? Grass. ? Weeds.  Indoor things, like: ? Dust. ? Smoke. ? Pet dander.  Foods.  Medicines.  Things that bother your skin, like: ? Detergents. ? Chemicals. ? Latex.  Perfume.  Bugs. An allergy cannot spread from person to person (is not contagious). Follow these  instructions at home:         Stay away from things that you know you are allergic to.  If you have allergies to things in the air, wash out your nose each day. Do it with one of these: ? A salt-water (saline) spray. ? A container (neti pot).  Take over-the-counter and prescription medicines only as told by your doctor.  Keep all follow-up visits as told by your doctor. This is important.  If you are at risk for a very bad allergy reaction (anaphylaxis), keep an auto-injector with you all the time. This is called an epinephrine injection. ? This is pre-measured medicine with a needle. You can put it into your skin by yourself. ? Right after you have a very bad allergy reaction, you or a person with you must give the medicine in less than a few minutes. This is an emergency.  If you have ever had a very bad allergy reaction, wear a medical alert bracelet or necklace. Your very bad allergy should be written on it. Contact a health care provider if:  Your symptoms do not get better with treatment. Get help right away if:  You have symptoms of a very bad allergy reaction. These include: ? A swollen mouth, tongue, or throat. ? Pain or tightness in your chest. ? Trouble breathing. ? Being short of breath. ? Dizziness. ? Fainting. ? Very bad pain in your belly (abdomen). ? Throwing up (vomiting). ? Watery poop (diarrhea). Summary  An allergy means that your body reacts to something that bothers it (allergen). It is not a normal reaction.  Stay away from things that make your body react.  Take over-the-counter and prescription medicines only as told by your doctor.  If you are at risk for a very bad allergy reaction, carry an auto-injector (epinephrine injection) all the time. Also, wear a medical alert bracelet or necklace so people know about your allergy. This information is not intended to replace advice given to you by your health care provider. Make sure you discuss any  questions you have with your health care provider. Document Revised: 06/18/2018 Document Reviewed: 06/12/2016 Elsevier Patient Education  2020 [employer] Sinusitis, Adult Sinusitis is soreness and swelling (inflammation) of your sinuses. Sinuses are hollow spaces in the bones around your face. They are located:  Around your eyes.  In the middle of your forehead.  Behind your nose.  In your cheekbones. Your sinuses and nasal passages are lined with a fluid called mucus. Mucus drains out of your sinuses. Swelling can trap mucus in your sinuses. This lets germs (bacteria, virus, or fungus) grow, which leads to infection. Most of the time, this condition is caused by a virus. What are the causes? This condition is caused by:  Allergies.  Asthma.  Germs.  Things that block your nose or sinuses.  Growths in the nose (nasal polyps).  Chemicals or irritants in the air.  Fungus (rare). What increases the risk? You are more likely to develop this condition if:  You have a weak body defense system (immune system).  You do a lot of swimming or diving.  You use nasal sprays too much.  You smoke. What are the signs or symptoms? The main symptoms of this condition are pain and a feeling of pressure around the sinuses. Other  symptoms include:  Stuffy nose (congestion).  Runny nose (drainage).  Swelling and warmth in the sinuses.  Headache.  Toothache.  A cough that may get worse at night.  Mucus that collects in the throat or the back of the nose (postnasal drip).  Being unable to smell and taste.  Being very tired (fatigue).  A fever.  Sore throat.  Bad breath. How is this diagnosed? This condition is diagnosed based on:  Your symptoms.  Your medical history.  A physical exam.  Tests to find out if your condition is short-term (acute) or long-term (chronic). Your doctor may: ? Check your nose for growths (polyps). ? Check your sinuses using a tool  that has a light (endoscope). ? Check for allergies or germs. ? Do imaging tests, such as an MRI or CT scan. How is this treated? Treatment for this condition depends on the cause and whether it is short-term or long-term.  If caused by a virus, your symptoms should go away on their own within 10 days. You may be given medicines to relieve symptoms. They include: ? Medicines that shrink swollen tissue in the nose. ? Medicines that treat allergies (antihistamines). ? A spray that treats swelling of the nostrils. ? Rinses that help get rid of thick mucus in your nose (nasal saline washes).  If caused by bacteria, your doctor may wait to see if you will get better without treatment. You may be given antibiotic medicine if you have: ? A very bad infection. ? A weak body defense system.  If caused by growths in the nose, you may need to have surgery. Follow these instructions at home: Medicines  Take, use, or apply over-the-counter and prescription medicines only as told by your doctor. These may include nasal sprays.  If you were prescribed an antibiotic medicine, take it as told by your doctor. Do not stop taking the antibiotic even if you start to feel better. Hydrate and humidify   Drink enough water to keep your pee (urine) pale yellow.  Use a cool mist humidifier to keep the humidity level in your home above 50%.  Breathe in steam for 10-15 minutes, 3-4 times a day, or as told by your doctor. You can do this in the bathroom while a hot shower is running.  Try not to spend time in cool or dry air. Rest  Rest as much as you can.  Sleep with your head raised (elevated).  Make sure you get enough sleep each night. General instructions   Put a warm, moist washcloth on your face 3-4 times a day, or as often as told by your doctor. This will help with discomfort.  Wash your hands often with soap and water. If there is no soap and water, use hand sanitizer.  Do not smoke.  Avoid being around people who are smoking (secondhand smoke).  Keep all follow-up visits as told by your doctor. This is important. Contact a doctor if:  You have a fever.  Your symptoms get worse.  Your symptoms do not get better within 10 days. Get help right away if:  You have a very bad headache.  You cannot stop throwing up (vomiting).  You have very bad pain or swelling around your face or eyes.  You have trouble seeing.  You feel confused.  Your neck is stiff.  You have trouble breathing. Summary  Sinusitis is swelling of your sinuses. Sinuses are hollow spaces in the bones around your face.  This condition is  caused by tissues in your nose that become inflamed or swollen. This traps germs. These can lead to infection.  If you were prescribed an antibiotic medicine, take it as told by your doctor. Do not stop taking it even if you start to feel better.  Keep all follow-up visits as told by your doctor. This is important. This information is not intended to replace advice given to you by your health care provider. Make sure you discuss any questions you have with your health care provider. Document Revised: 07/30/2017 Document Reviewed: 07/30/2017 Elsevier Patient Education  2020 [employer].

## 2020-02-03 NOTE — Telephone Encounter (Signed)
FYI. I think pt has mychart appt with you later today.

## 2020-02-03 NOTE — Telephone Encounter (Signed)
If patient declined virtual visit per office protocol , please have him be seen at urgent care or [facility] today.

## 2020-02-03 NOTE — Telephone Encounter (Signed)
Patient is wanting to be seen in the office instead of a virtual visit. Please advise. Thanks!

## 2020-02-03 NOTE — Telephone Encounter (Signed)
Noted on schedule for virtual. Can go for in person at [facility] / ED if needed.

## 2020-02-03 NOTE — Telephone Encounter (Signed)
Wife called back and advised per office protocol, pt must have virtual visit.  She is not happy about that. She states he is a smoker, and coughs all the time. She is concerned something else might be wrong.

## 2020-02-03 NOTE — Telephone Encounter (Signed)
Copied from CRM [id number]. Topic: General - Other >> Feb 03, 2020 10:03 AM Herby Abraham C wrote: Reason for CRM: pts spouse called in to schedule an apt. She says that pt is still suffering from congestion and  a cough. Pt has been seen several times for this, pt has had negative covid test. Spouse would like to have pt seen in office if possible because she feels that a vv isn't helping. Provider Marcelino Duster Flinchum has opening at 4p, pt would like to know if provider could see him today if possible.    Please assist.   CB: [phone]

## 2020-02-25 ENCOUNTER — Ambulatory Visit: Payer: BC Managed Care – PPO

## 2020-03-18 ENCOUNTER — Ambulatory Visit
Admission: RE | Admit: 2020-03-18 | Discharge: 2020-03-18 | Disposition: A | Discharge status: Home / Self Care | Payer: BC Managed Care – PPO | Source: Ambulatory Visit | Attending: Adult Health | Admitting: Adult Health

## 2020-03-18 ENCOUNTER — Telehealth: Payer: Self-pay

## 2020-03-18 ENCOUNTER — Other Ambulatory Visit: Payer: Self-pay

## 2020-03-18 DIAGNOSIS — J324 Chronic pansinusitis: Secondary | ICD-10-CM | POA: Diagnosis present

## 2020-03-18 DIAGNOSIS — R609 Edema, unspecified: Secondary | ICD-10-CM

## 2020-03-18 NOTE — Progress Notes (Signed)
Referral to pulmonary advised, emphysema like changed worse in left upper lobe and increased from last exam.  He was last seen in November by me, will need new office follow up if new symptoms. He is doing ordered tests from last visit today 03/18/2020 it appears.

## 2020-03-18 NOTE — Telephone Encounter (Signed)
-----   Message from Berniece Pap, FNP sent at 03/18/2020 11:19 AM EST ----- Moderate Mucosal edema throughout all sinuses, sinus drainage pathways clear, CT does show a deviated septum. Please place referral to ENT if he needs or If he is already established he should schedule appointment with ENT. If ongoing or new symptoms call for virtual visit here.  Continue Xyzal and Flonase/ azestaline nasal spray advised.

## 2020-03-18 NOTE — Progress Notes (Signed)
Moderate Mucosal edema throughout all sinuses, sinus drainage pathways clear, CT does show a deviated septum. Please place referral to ENT if he needs or If he is already established he should schedule appointment with ENT. If ongoing or new symptoms call for virtual visit here.  Continue Xyzal and Flonase/ azestaline nasal spray advised.

## 2020-04-03 ENCOUNTER — Other Ambulatory Visit: Payer: Self-pay | Admitting: Adult Health

## 2020-04-03 NOTE — Telephone Encounter (Signed)
Requested Prescriptions  Pending Prescriptions Disp Refills  . Azelastine HCl 0.15 % SOLN [Pharmacy Med Name: AZELASTINE 0.15% NASAL SPRAY] 30 mL 0    Sig: PLACE 1 SPRAY INTO THE NOSE IN THE MORNING AND AT BEDTIME.     Ear, Nose, and Throat: Nasal Preparations - Antiallergy Passed - 04/03/2020  9:54 AM      Passed - Valid encounter within last 12 months    Recent Outpatient Visits          2 months ago Wheezing   [facility] Flinchum, Eula Fried, FNP   8 months ago Acute non-recurrent pansinusitis   [facility] Osvaldo Angst M, [place]   9 months ago Acute non-recurrent pansinusitis   [facility] Flinchum, Eula Fried, FNP   1 year ago Right elbow pain   [facility] Trey Sailors, [place]   1 year ago Subacute maxillary sinusitis   [facility] Chrismon, Jodell Cipro, [place]

## 2020-06-22 ENCOUNTER — Telehealth: Payer: Self-pay | Admitting: Family Medicine

## 2020-06-22 NOTE — Telephone Encounter (Signed)
Patient has been advised, I encouraged him to go to urgent care today or tomorrow for evaluation because there is no openings in office for the rest of week. KW

## 2020-06-22 NOTE — Telephone Encounter (Signed)
As far as I know there is nothing patient can take otc to treat this please review message below and advise. KW

## 2020-06-22 NOTE — Telephone Encounter (Signed)
Correct patient should be seen, do not recommend over the counter treatment unless evaluated.

## 2020-06-22 NOTE — Telephone Encounter (Signed)
Copied from CRM [id number]. Topic: General - Other >> Jun 22, 2020 11:42 AM Herby Abraham C wrote: Reason for CRM: pt called in for assistance. Pt says that he has a rash on his arm. Pt says that rash looks like possible shingles. Pt would like to know if provider could suggest something that he could use OTC? Offered pt first available apt, pt says that he would just like to be advised if possible.    Please advise further.   CB: [phone]

## 2020-06-28 ENCOUNTER — Other Ambulatory Visit: Payer: Self-pay | Admitting: Adult Health

## 2020-06-28 DIAGNOSIS — J324 Chronic pansinusitis: Secondary | ICD-10-CM

## 2020-06-28 MED ORDER — AZELASTINE HCL 0.15 % NA SOLN: 1.0000 | Freq: Two times a day (BID) | NASAL | 0 refills | Status: DC

## 2020-06-28 MED ORDER — LEVOCETIRIZINE DIHYDROCHLORIDE 5 MG PO TABS: 5.0000 mg | ORAL_TABLET | Freq: Every evening | ORAL | 0 refills | Status: DC

## 2020-08-01 ENCOUNTER — Other Ambulatory Visit: Payer: Self-pay | Admitting: Family Medicine

## 2020-08-01 NOTE — Telephone Encounter (Signed)
Requested Prescriptions  Pending Prescriptions Disp Refills  . Azelastine HCl 0.15 % SOLN [Pharmacy Med Name: AZELASTINE 0.15% NASAL SPRAY] 30 mL 0    Sig: PLACE 1 SPRAY INTO THE NOSE IN THE MORNING AND AT BEDTIME.     Ear, Nose, and Throat: Nasal Preparations - Antiallergy Passed - 08/01/2020  9:41 AM      Passed - Valid encounter within last 12 months    Recent Outpatient Visits          6 months ago Wheezing   [facility] Flinchum, Eula Fried, FNP   1 year ago Acute non-recurrent pansinusitis   [facility] Trey Sailors, [place]   1 year ago Acute non-recurrent pansinusitis   [facility] Flinchum, Eula Fried, FNP   1 year ago Right elbow pain   [facility] Trey Sailors, [place]   1 year ago Subacute maxillary sinusitis   [facility] Chrismon, Jodell Cipro, [place]

## 2020-09-12 ENCOUNTER — Other Ambulatory Visit: Payer: Self-pay | Admitting: Family Medicine

## 2020-09-12 NOTE — Telephone Encounter (Signed)
Requested Prescriptions  Pending Prescriptions Disp Refills  . Azelastine HCl 0.15 % SOLN [Pharmacy Med Name: AZELASTINE 0.15% NASAL SPRAY] 30 mL 0    Sig: PLACE 1 SPRAY INTO THE NOSE IN THE MORNING AND AT BEDTIME.     Ear, Nose, and Throat: Nasal Preparations - Antiallergy Passed - 09/12/2020  9:16 AM      Passed - Valid encounter within last 12 months    Recent Outpatient Visits          7 months ago Wheezing   [facility] Flinchum, Eula Fried, FNP   1 year ago Acute non-recurrent pansinusitis   [facility] Trey Sailors, [place]   1 year ago Acute non-recurrent pansinusitis   [facility] Flinchum, Eula Fried, FNP   1 year ago Right elbow pain   [facility] Trey Sailors, [place]   1 year ago Subacute maxillary sinusitis   [facility] Chrismon, Jodell Cipro, [place]

## 2020-09-30 ENCOUNTER — Other Ambulatory Visit: Payer: Self-pay | Admitting: Family Medicine

## 2020-09-30 DIAGNOSIS — J324 Chronic pansinusitis: Secondary | ICD-10-CM

## 2020-09-30 NOTE — Telephone Encounter (Signed)
Requested Prescriptions  Pending Prescriptions Disp Refills  . levocetirizine (XYZAL) 5 MG tablet [Pharmacy Med Name: LEVOCETIRIZINE 5 MG TABLET] 90 tablet 0    Sig: TAKE 1 TABLET BY MOUTH EVERY DAY IN THE EVENING     Ear, Nose, and Throat:  Antihistamines Passed - 09/30/2020  4:14 AM      Passed - Valid encounter within last 12 months    Recent Outpatient Visits          8 months ago Wheezing   [facility] Flinchum, Eula Fried, FNP   1 year ago Acute non-recurrent pansinusitis   [facility] [place], Lavella Hammock, PA-C   1 year ago Acute non-recurrent pansinusitis   [facility] Flinchum, Eula Fried, FNP   1 year ago Right elbow pain   [facility] Trey Sailors, [place]   1 year ago Subacute maxillary sinusitis   [facility] Chrismon, Jodell Cipro, [place]

## 2020-11-16 ENCOUNTER — Emergency Department: Payer: BC Managed Care – PPO

## 2020-11-16 ENCOUNTER — Other Ambulatory Visit: Payer: Self-pay

## 2020-11-16 ENCOUNTER — Ambulatory Visit
Admission: EM | Admit: 2020-11-16 | Discharge: 2020-11-17 | Disposition: A | Discharge status: Home / Self Care | Payer: BC Managed Care – PPO | Attending: Emergency Medicine | Admitting: Emergency Medicine

## 2020-11-16 ENCOUNTER — Encounter: Payer: Self-pay | Admitting: Emergency Medicine

## 2020-11-16 ENCOUNTER — Encounter
Admission: EM | Disposition: A | Discharge status: Home / Self Care | Payer: Self-pay | Source: Home / Self Care | Attending: Emergency Medicine

## 2020-11-16 DIAGNOSIS — I10 Essential (primary) hypertension: Secondary | ICD-10-CM | POA: Insufficient documentation

## 2020-11-16 DIAGNOSIS — Z79899 Other long term (current) drug therapy: Secondary | ICD-10-CM | POA: Insufficient documentation

## 2020-11-16 DIAGNOSIS — E785 Hyperlipidemia, unspecified: Secondary | ICD-10-CM | POA: Diagnosis not present

## 2020-11-16 DIAGNOSIS — T18128A Food in esophagus causing other injury, initial encounter: Secondary | ICD-10-CM

## 2020-11-16 DIAGNOSIS — K2100 Gastro-esophageal reflux disease with esophagitis, without bleeding: Secondary | ICD-10-CM | POA: Insufficient documentation

## 2020-11-16 DIAGNOSIS — R0789 Other chest pain: Secondary | ICD-10-CM

## 2020-11-16 DIAGNOSIS — T182XXA Foreign body in stomach, initial encounter: Secondary | ICD-10-CM | POA: Diagnosis not present

## 2020-11-16 DIAGNOSIS — F17200 Nicotine dependence, unspecified, uncomplicated: Secondary | ICD-10-CM | POA: Diagnosis not present

## 2020-11-16 DIAGNOSIS — Z20822 Contact with and (suspected) exposure to covid-19: Secondary | ICD-10-CM | POA: Insufficient documentation

## 2020-11-16 DIAGNOSIS — Z8249 Family history of ischemic heart disease and other diseases of the circulatory system: Secondary | ICD-10-CM | POA: Diagnosis not present

## 2020-11-16 DIAGNOSIS — X58XXXA Exposure to other specified factors, initial encounter: Secondary | ICD-10-CM | POA: Insufficient documentation

## 2020-11-16 DIAGNOSIS — Z888 Allergy status to other drugs, medicaments and biological substances status: Secondary | ICD-10-CM | POA: Insufficient documentation

## 2020-11-16 LAB — RESP PANEL BY RT-PCR (FLU A&B, COVID) ARPGX2
Influenza A by PCR: NEGATIVE
Influenza B by PCR: NEGATIVE
SARS Coronavirus 2 by RT PCR: NEGATIVE

## 2020-11-16 LAB — CBC WITH DIFFERENTIAL/PLATELET
Abs Immature Granulocytes: 0.02 10*3/uL (ref 0.00–0.07)
Basophils Absolute: 0.1 10*3/uL (ref 0.0–0.1)
Basophils Relative: 1 %
Eosinophils Absolute: 0.4 10*3/uL (ref 0.0–0.5)
Eosinophils Relative: 5 %
HCT: 43.5 % (ref 39.0–52.0)
Hemoglobin: 15.9 g/dL (ref 13.0–17.0)
Immature Granulocytes: 0 %
Lymphocytes Relative: 42 %
Lymphs Abs: 3.2 10*3/uL (ref 0.7–4.0)
MCH: 31.2 pg (ref 26.0–34.0)
MCHC: 36.6 g/dL — ABNORMAL HIGH (ref 30.0–36.0)
MCV: 85.3 fL (ref 80.0–100.0)
Monocytes Absolute: 0.5 10*3/uL (ref 0.1–1.0)
Monocytes Relative: 6 %
Neutro Abs: 3.6 10*3/uL (ref 1.7–7.7)
Neutrophils Relative %: 46 %
Platelets: 279 10*3/uL (ref 150–400)
RBC: 5.1 MIL/uL (ref 4.22–5.81)
RDW: 11.2 % — ABNORMAL LOW (ref 11.5–15.5)
WBC: 7.7 10*3/uL (ref 4.0–10.5)
nRBC: 0 % (ref 0.0–0.2)

## 2020-11-16 LAB — BASIC METABOLIC PANEL
Anion gap: 8 (ref 5–15)
BUN: 9 mg/dL (ref 6–20)
CO2: 23 mmol/L (ref 22–32)
Calcium: 9 mg/dL (ref 8.9–10.3)
Chloride: 107 mmol/L (ref 98–111)
Creatinine, Ser: 0.89 mg/dL (ref 0.61–1.24)
GFR, Estimated: >60 mL/min (ref >60)
Glucose, Bld: 106 mg/dL — ABNORMAL HIGH (ref 70–99)
Potassium: 3.8 mmol/L (ref 3.5–5.1)
Sodium: 138 mmol/L (ref 135–145)

## 2020-11-16 LAB — TROPONIN I (HIGH SENSITIVITY): Troponin I (High Sensitivity): 5 ng/L (ref <18)

## 2020-11-16 SURGERY — EGD (ESOPHAGOGASTRODUODENOSCOPY)
Anesthesia: General

## 2020-11-16 MED ORDER — ONDANSETRON HCL 4 MG/2ML IJ SOLN
4.0000 mg | Freq: Once | INTRAMUSCULAR | Status: AC
  Administered 2020-11-16: 4 mg via INTRAVENOUS
  Filled 2020-11-16: qty 2

## 2020-11-16 MED ORDER — DIAZEPAM 5 MG/ML IJ SOLN
5.0000 mg | Freq: Once | INTRAMUSCULAR | Status: AC
  Administered 2020-11-16: 5 mg via INTRAVENOUS
  Filled 2020-11-16: qty 2

## 2020-11-16 MED ORDER — GLUCAGON HCL RDNA (DIAGNOSTIC) 1 MG IJ SOLR
1.0000 mg | Freq: Once | INTRAMUSCULAR | Status: AC
  Administered 2020-11-16: 1 mg via INTRAVENOUS
  Filled 2020-11-16: qty 1

## 2020-11-16 NOTE — ED Provider Notes (Signed)
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 No superimposed focal pulmonary infiltrate. No definite pneumothorax. No pleural effusion. Cardiac size within normal limits. No acute bone abnormality. IMPRESSION: No active cardiopulmonary disease. Stable changes of bullous emphysema, asymmetrically more severe within the left apex. Electronically Signed   By: Helyn Numbers M.D.   On: 11/16/2020 21:45     EKG is reviewed inter by 2245 Heart rate 95 QRS 99 QTc 450 Normal sinus rhythm, incomplete right bundle branch block. No noted evidence of acute ischemia  ----------------------------------------- 10:55 PM on 11/16/2020 ----------------------------------------- Labs were reviewed, CBC and metabolic panel essentially unremarkable.  Normal troponin.  Discussed with the patient reports he has now spit up a couple very small pieces of what appear to be white meat chicken.  He feels like some of the blockage is improved, and would like to try drinking something to see if it will pass.  Provided the patient a ginger ale at this time to evaluate for possible relief of suspected esophageal obstruction.  He is fully alert and oriented without distress.  Does still have a slight stuck sensation in his chest but reports that seems to be getting better    Sharyn Creamer, MD 11/16/20 2256

## 2020-11-16 NOTE — ED Triage Notes (Addendum)
Pt arrived via POV with reports of something stuck in his throat, pt reports he was eating chicken earlier and thinks something is stuck in his throat. Pt states he has tried to drink water and would come back up.  Tried coke with no relief of sxs

## 2020-11-16 NOTE — ED Provider Notes (Signed)
[facility] Emergency Department Provider Note  ____________________________________________   Event Date/Time   First MD Initiated Contact with Patient 11/16/20 2127     (approximate)  I have reviewed the triage vital signs and the nursing notes.   HISTORY  Chief Complaint Food Bolus    HPI Kyle Clark is a 50 y.o. male with past medical history as below here with difficulty swallowing.  The patient states he was in his usual state of health until he was eating a chicken meal from [employer] this afternoon.  He states that he swallowed and then felt like food got stuck in his upper chest.  Since then, he has had intermittent aching, burning, severe, squeezing pain in his chest with associated nausea.  Has been unable to eat or drink since then.  He said difficulty even tolerating secretions.  He has a remote history of GERD, also fairly regular alcohol use.  He has remote history of smoking as well.  Denies any recent increase in acid reflux.  Denies any recent difficulty swallowing.  No fevers or chills.  No other complaints.    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 Authorizing Provider  albuterol (VENTOLIN HFA) 108 (90 Base) MCG/ACT inhaler Inhale 1-2 puffs into the lungs every 6 (six) hours as needed for wheezing or shortness of breath.  02/03/20   Flinchum, Eula Fried, FNP  amoxicillin-clavulanate (AUGMENTIN) 875-125 MG tablet Take 1 tablet by mouth 2 (two) times daily. 02/03/20   Flinchum, Eula Fried, FNP  Azelastine HCl 0.15 % SOLN PLACE 1 SPRAY INTO THE NOSE IN THE MORNING AND AT BEDTIME. 09/12/20   Chrismon, Jodell Cipro, PA-C  benzonatate (TESSALON) 100 MG capsule Take 1 capsule (100 mg total) by mouth 2 (two) times daily as needed for cough. 02/03/20   Flinchum, Eula Fried, FNP  fluticasone (FLONASE) 50 MCG/ACT nasal spray Place 2 sprays into both nostrils daily. 07/08/19   Flinchum, Eula Fried, FNP  levocetirizine (XYZAL) 5 MG tablet TAKE 1 TABLET BY MOUTH EVERY DAY IN THE EVENING 09/30/20   Chrismon, Jodell Cipro, PA-C  predniSONE (STERAPRED UNI-PAK 21 TAB) 10 MG (21) TBPK tablet PO: Take 6 tablets on day 1:Take 5 tablets day 2:Take 4 tablets day 3: Take 3 tablets day 4:Take 2 tablets day five: 5 Take 1 tablet day 6 02/03/20   Flinchum, Eula Fried, FNP    Allergies Lisinopril  Family History  Problem Relation Age of Onset   Healthy Mother    Congestive Heart Failure Father    Healthy Sister    Healthy Daughter     Social History Social History   Tobacco Use   Smoking status: Every Day    Packs/day: 0.50    Years: 25.00    Pack years: 12.50    Types: Cigarettes   Smokeless tobacco: Never  Substance Use Topics   Alcohol use: Yes    Comment: occasional alcohol use, drinks beer on weekends maybe once a week   Drug use: No    Review of Systems  Review of Systems  Constitutional:  Negative for chills, fatigue and fever.  HENT:  Negative for sore throat.   Respiratory:  Negative for shortness of breath.   Cardiovascular:  Positive for chest pain.  Gastrointestinal:  Positive for nausea and vomiting. Negative for abdominal pain.  Genitourinary:  Negative for flank pain.  Musculoskeletal:  Negative for neck pain.  Skin:  Negative for rash and wound.  Allergic/Immunologic: Negative for immunocompromised state.   Neurological:  Negative for weakness and numbness.  Hematological:  Does not bruise/bleed easily.  All other systems reviewed and are negative.   ____________________________________________  PHYSICAL EXAM:      VITAL SIGNS: ED Triage Vitals  Enc Vitals Group     BP 11/16/20 2111 (!) 151/98     Pulse Rate 11/16/20 2111 82     Resp 11/16/20 2111 18     Temp 11/16/20 2111 98.5 F (36.9 C)     Temp Source 11/16/20 2111 Oral     SpO2 11/16/20 2111 97 %     Weight 11/16/20 2112 138 lb (62.6 kg)     Height 11/16/20 2112 5' 8.5" (1.74 m)     Head Circumference --      Peak Flow --      Pain Score 11/16/20 2112 4     Pain Loc --      Pain Edu? --      Excl. in GC? --      Physical Exam Vitals and nursing note reviewed.  Constitutional:      General: He is not in acute distress.    Appearance: He is well-developed.  HENT:     Head: Normocephalic and atraumatic.  Eyes:     Conjunctiva/sclera: Conjunctivae normal.  Cardiovascular:     Rate and Rhythm: Normal rate and regular rhythm.     Heart sounds: Normal heart sounds.  Pulmonary:     Effort: Pulmonary effort is normal. No respiratory distress.     Breath sounds: No wheezing.  Abdominal:     General: There is no distension.     Comments: Mild epigastric tenderness  Musculoskeletal:     Cervical back: Neck supple.  Skin:    General: Skin is warm.     Capillary Refill: Capillary refill takes less than 2 seconds.     Findings: No rash.  Neurological:     Mental Status: He is alert and oriented to person, place, and time.     Motor: No abnormal muscle tone.      ____________________________________________   LABS (all labs ordered are listed, but only abnormal results are displayed)  Labs Reviewed  RESP PANEL BY RT-PCR (FLU A&B, COVID) ARPGX2  CBC WITH DIFFERENTIAL/PLATELET  BASIC METABOLIC PANEL  TROPONIN I (HIGH SENSITIVITY)    ____________________________________________  EKG:  Pending ________________________________________  RADIOLOGY All imaging, including plain films, CT scans, and ultrasounds, independently reviewed by me, and interpretations confirmed via formal radiology reads.  ED MD interpretation:   Pending  Official radiology report(s): No results found.  ____________________________________________  PROCEDURES   Procedure(s) performed (including Critical Care):  Procedures  ____________________________________________  INITIAL IMPRESSION / MDM / ASSESSMENT AND PLAN / ED COURSE  As part of my medical decision making, I reviewed the following data within the electronic MEDICAL RECORD NUMBER Nursing  notes reviewed and incorporated, Old chart reviewed, Notes from prior ED visits, and [place] Controlled Substance Database       *Kyle Clark was evaluated in Emergency Department on 11/16/2020 for the symptoms described in the history of present illness. He was evaluated in the context of the global COVID-19 pandemic, which necessitated consideration that the patient might be at risk for infection with the SARS-CoV-2 virus that causes COVID-19. Institutional protocols and algorithms that pertain to the evaluation of patients at risk for COVID-19 are in a state of rapid change based on information released by regulatory bodies including the CDC and federal and state organizations. These policies and algorithms were followed during the patient's care in the ED.  Some ED evaluations and interventions may be delayed as a result of limited staffing during the pandemic.*     Medical Decision Making: 50 year old male here with suspected esophageal food impaction.  Patient will be given glucagon, Valium, and monitored.  Given patient's absence of any preceding GI symptoms and no history of the same, will obtain additional lab work including EKG and troponin, the symptoms are certainly more consistent with food impaction.  Otherwise, abdomen is soft without focal  tenderness or signs of peritonitis.  Plan to follow-up response to medications and labs.  ____________________________________________  FINAL CLINICAL IMPRESSION(S) / ED DIAGNOSES  Final diagnoses:  Food impaction of esophagus, initial encounter  Atypical chest pain     MEDICATIONS GIVEN DURING THIS VISIT:  Medications  glucagon (human recombinant) (GLUCAGEN) injection 1 mg (has no administration in time range)  ondansetron (ZOFRAN) injection 4 mg (has no administration in time range)  diazepam (VALIUM) injection 5 mg (has no administration in time range)     ED Discharge Orders     None        Note:  This document was prepared using Dragon voice recognition software and may include unintentional dictation errors.   Shaune Pollack, MD 11/16/20 2146

## 2020-11-16 NOTE — ED Provider Notes (Signed)
Discussed case with Dr. Timothy Lasso. GI MD will be in to see patient in about 1 hour for in person consult for concerns of esophageal obstruction.    Sharyn Creamer, MD 11/16/20 2318

## 2020-11-16 NOTE — ED Provider Notes (Signed)
Patient awaiting GI consultation.  Remains in no distress he is understanding agreeable with plan for GI consult.  Symptoms appear most consistent with esophageal supraduction likely due to food bolus/impaction.  Further work-up per GI consult.  Ongoing care in the ER assigned to Dr. Josue Hector, MD 11/16/20 2351

## 2020-11-16 NOTE — Consult Note (Signed)
[facility] Inpatient Consultation   Patient ID: Kyle Clark is a 50 y.o. male.  Requesting Provider: Dr. Sharyn Creamer  Date of Admission: 11/16/2020  Date of Consult: 11/16/20   Reason for Consultation: Food impaction  Patient's Chief Complaint:   Chief Complaint  Patient presents with   Food Bolus    50 year old Caucasian male with hypertension and hyperlipidemia who presents for food bolus impaction for which gastroenterology was consulted.  He is accompanied by his wife in the emergency department hallway.  Patient was in his normal state of health when he was eating today at approximately 1930 when he felt that part of his chicken dinner became lodged in his esophagus.  After that point in time he has not been able to tolerate any further solids liquids or secretions, requiring him to spit up in a bag.  He is currently in the emergency department hallway still unable to handle his secretions despite glucagon and Valium given by the emergency medicine provider.  He denies any shortness of breath.  He denies previous dysphagia, odynophagia, or atopic history leading up to this event.  He has rare acid reflux.  No active vomiting or abdominal pain.  Denies any recent NSAIDs or antibiotics.  No blood thinners.  Active tobacco use cigarettes and vape He has never undergone endoscopy.  Denies NSAIDs, Anti-plt agents, and anticoagulants Denies family history of gastrointestinal disease and malignancy Previous Endoscopies: none   Past Medical History:  Diagnosis Date   Allergic rhinitis    Hyperlipidemia    Hypertension     Past Surgical History:  Procedure Laterality Date   NO PAST SURGERIES      Allergies  Allergen Reactions   Lisinopril Palpitations    Other reaction(s): Rapid pulse High blood pressure     Family History  Problem Relation Age of Onset   Healthy Mother    Congestive Heart Failure Father    Healthy Sister    Healthy Daughter      Social History   Tobacco Use   Smoking status: Every Day    Packs/day: 0.50    Years: 25.00    Pack years: 12.50    Types: Cigarettes   Smokeless tobacco: Never  Substance Use Topics   Alcohol use: Yes    Comment: occasional alcohol use, drinks beer on weekends maybe once a week   Drug use: No     Pertinent GI related history and allergies were reviewed with the patient  Review of Systems  Constitutional:  Negative for activity change, appetite change, chills, fatigue, fever and unexpected weight change.  HENT:  Positive for trouble swallowing. Negative for voice change.   Respiratory:  Negative for shortness of breath.   Cardiovascular:  Positive for chest pain (relating to food impaction). Negative for palpitations.  Gastrointestinal:  Positive for nausea. Negative for abdominal distention, abdominal pain, anal bleeding, blood in stool, constipation, diarrhea and vomiting.  Musculoskeletal:  Negative for arthralgias and myalgias.  Skin:  Negative for color change and pallor.  Neurological:  Negative for dizziness, syncope and weakness.  Psychiatric/Behavioral:  Negative for confusion. The patient is not nervous/anxious.   All other systems reviewed and are negative.   Medications Home Medications No current facility-administered medications on file prior to encounter.   Current Outpatient Medications on File Prior to Encounter  Medication Sig Dispense Refill   albuterol (VENTOLIN HFA) 108 (90 Base) MCG/ACT inhaler Inhale 1-2 puffs into the lungs every 6 (six) hours as needed for wheezing  or shortness of breath. 8 g 0   amoxicillin-clavulanate (AUGMENTIN) 875-125 MG tablet Take 1 tablet by mouth 2 (two) times daily. 20 tablet 0   Azelastine HCl 0.15 % SOLN PLACE 1 SPRAY INTO THE NOSE IN THE MORNING AND AT BEDTIME. 30 mL 0   benzonatate (TESSALON) 100 MG capsule Take 1 capsule (100 mg total) by mouth 2 (two) times daily as needed for cough. 20 capsule 0   fluticasone  (FLONASE) 50 MCG/ACT nasal spray Place 2 sprays into both nostrils daily. 16 g 1   levocetirizine (XYZAL) 5 MG tablet TAKE 1 TABLET BY MOUTH EVERY DAY IN THE EVENING 90 tablet 0   predniSONE (STERAPRED UNI-PAK 21 TAB) 10 MG (21) TBPK tablet PO: Take 6 tablets on day 1:Take 5 tablets day 2:Take 4 tablets day 3: Take 3 tablets day 4:Take 2 tablets day five: 5 Take 1 tablet day 6 21 tablet 0   Pertinent GI related medications were reviewed with the patient  Inpatient Medications No current facility-administered medications for this encounter.  Current Outpatient Medications:    albuterol (VENTOLIN HFA) 108 (90 Base) MCG/ACT inhaler, Inhale 1-2 puffs into the lungs every 6 (six) hours as needed for wheezing or shortness of breath., Disp: 8 g, Rfl: 0   amoxicillin-clavulanate (AUGMENTIN) 875-125 MG tablet, Take 1 tablet by mouth 2 (two) times daily., Disp: 20 tablet, Rfl: 0   Azelastine HCl 0.15 % SOLN, PLACE 1 SPRAY INTO THE NOSE IN THE MORNING AND AT BEDTIME., Disp: 30 mL, Rfl: 0   benzonatate (TESSALON) 100 MG capsule, Take 1 capsule (100 mg total) by mouth 2 (two) times daily as needed for cough., Disp: 20 capsule, Rfl: 0   fluticasone (FLONASE) 50 MCG/ACT nasal spray, Place 2 sprays into both nostrils daily., Disp: 16 g, Rfl: 1   levocetirizine (XYZAL) 5 MG tablet, TAKE 1 TABLET BY MOUTH EVERY DAY IN THE EVENING, Disp: 90 tablet, Rfl: 0   predniSONE (STERAPRED UNI-PAK 21 TAB) 10 MG (21) TBPK tablet, PO: Take 6 tablets on day 1:Take 5 tablets day 2:Take 4 tablets day 3: Take 3 tablets day 4:Take 2 tablets day five: 5 Take 1 tablet day 6, Disp: 21 tablet, Rfl: 0      Objective   Vitals:   11/16/20 2111 11/16/20 2112  BP: (!) 151/98   Pulse: 82   Resp: 18   Temp: 98.5 F (36.9 C)   TempSrc: Oral   SpO2: 97%   Weight:  62.6 kg  Height:  5' 8.5" (1.74 m)     Physical Exam Vitals and nursing note reviewed.  Constitutional:      Appearance: Normal appearance. He is not toxic-appearing  or diaphoretic.     Comments: Uncomfortable appearing  HENT:     Head: Normocephalic and atraumatic.     Nose: Nose normal.     Mouth/Throat:     Mouth: Mucous membranes are moist.     Pharynx: Oropharynx is clear.     Comments: Actively spitting into bag Eyes:     General: No scleral icterus.    Extraocular Movements: Extraocular movements intact.  Cardiovascular:     Rate and Rhythm: Normal rate and regular rhythm.     Heart sounds: Normal heart sounds. No murmur heard.   No friction rub. No gallop.  Pulmonary:     Effort: Pulmonary effort is normal. No respiratory distress.     Breath sounds: Normal breath sounds. No wheezing, rhonchi or rales.  Abdominal:  General: Bowel sounds are normal. There is no distension.     Palpations: Abdomen is soft.     Tenderness: There is no abdominal tenderness. There is no guarding or rebound.  Musculoskeletal:     Cervical back: Neck supple.     Right lower leg: No edema.     Left lower leg: No edema.  Skin:    General: Skin is warm and dry.     Coloration: Skin is not jaundiced or pale.  Neurological:     General: No focal deficit present.     Mental Status: He is alert and oriented to person, place, and time. Mental status is at baseline.  Psychiatric:        Mood and Affect: Mood normal.        Behavior: Behavior normal.        Thought Content: Thought content normal.        Judgment: Judgment normal.    Laboratory Data Recent Labs  Lab 11/16/20 2130  WBC 7.7  HGB 15.9  HCT 43.5  PLT 279   Recent Labs  Lab 11/16/20 2130  NA 138  K 3.8  CL 107  CO2 23  BUN 9  CALCIUM 9.0  GLUCOSE 106*       Imaging Studies: DG Chest 2 View  Result Date: 11/16/2020 CLINICAL DATA:  Food impaction, dyspnea EXAM: CHEST - 2 VIEW COMPARISON:  03/18/2020 FINDINGS: Changes of bullous emphysema are again identified with asymmetric extensive bulla formation within the left apex, unchanged from prior examination. No superimposed focal  pulmonary infiltrate. No definite pneumothorax. No pleural effusion. Cardiac size within normal limits. No acute bone abnormality. IMPRESSION: No active cardiopulmonary disease. Stable changes of bullous emphysema, asymmetrically more severe within the left apex. Electronically Signed   By: Helyn Numbers M.D.   On: 11/16/2020 21:45    Assessment:  # Foreign body esophageal obstruction # tobacco dependence # HTN # HLD  Plan:  EGD with possible intervention for food impaction under general anesthesia Labs and imaging from today reviewed No oac anti pl tor nsaid use  Esophagogastroduodenoscopy with possible biopsy, control of bleeding, polypectomy, and interventions as necessary has been discussed with the patient/patient representative. Informed consent was obtained from the patient/patient representative after explaining the indication, nature, and risks of the procedure including but not limited to death, bleeding, perforation, missed neoplasm/lesions, cardiorespiratory compromise, and reaction to medications. Opportunity for questions was given and appropriate answers were provided. Patient/patient representative has verbalized understanding is amenable to undergoing the procedure.   I personally performed the service.  Management of other medical comorbidities as per primary team  Thank you for allowing [place] to participate in this patient's care. Please don't hesitate to call if any questions or concerns arise.   Jaynie Collins, DO [facility]  Portions of the record may have been created with voice recognition software. Occasional wrong-word or 'sound-a-like' substitutions may have occurred due to the inherent limitations of voice recognition software.  Read the chart carefully and recognize, using context, where substitutions may have occurred.

## 2020-11-16 NOTE — H&P (View-Only) (Signed)
[facility] Inpatient Consultation   Patient ID: Kyle Clark is a 50 y.o. male.  Requesting Provider: Dr. Sharyn Creamer  Date of Admission: 11/16/2020  Date of Consult: 11/16/20   Reason for Consultation: Food impaction  Patient's Chief Complaint:   Chief Complaint  Patient presents with   Food Bolus    50 year old Caucasian male with hypertension and hyperlipidemia who presents for food bolus impaction for which gastroenterology was consulted.  He is accompanied by his wife in the emergency department hallway.  Patient was in his normal state of health when he was eating today at approximately 1930 when he felt that part of his chicken dinner became lodged in his esophagus.  After that point in time he has not been able to tolerate any further solids liquids or secretions, requiring him to spit up in a bag.  He is currently in the emergency department hallway still unable to handle his secretions despite glucagon and Valium given by the emergency medicine provider.  He denies any shortness of breath.  He denies previous dysphagia, odynophagia, or atopic history leading up to this event.  He has rare acid reflux.  No active vomiting or abdominal pain.  Denies any recent NSAIDs or antibiotics.  No blood thinners.  Active tobacco use cigarettes and vape He has never undergone endoscopy.  Denies NSAIDs, Anti-plt agents, and anticoagulants Denies family history of gastrointestinal disease and malignancy Previous Endoscopies: none   Past Medical History:  Diagnosis Date   Allergic rhinitis    Hyperlipidemia    Hypertension     Past Surgical History:  Procedure Laterality Date   NO PAST SURGERIES      Allergies  Allergen Reactions   Lisinopril Palpitations    Other reaction(s): Rapid pulse High blood pressure     Family History  Problem Relation Age of Onset   Healthy Mother    Congestive Heart Failure Father    Healthy Sister    Healthy Daughter      Social History   Tobacco Use   Smoking status: Every Day    Packs/day: 0.50    Years: 25.00    Pack years: 12.50    Types: Cigarettes   Smokeless tobacco: Never  Substance Use Topics   Alcohol use: Yes    Comment: occasional alcohol use, drinks beer on weekends maybe once a week   Drug use: No     Pertinent GI related history and allergies were reviewed with the patient  Review of Systems  Constitutional:  Negative for activity change, appetite change, chills, fatigue, fever and unexpected weight change.  HENT:  Positive for trouble swallowing. Negative for voice change.   Respiratory:  Negative for shortness of breath.   Cardiovascular:  Positive for chest pain (relating to food impaction). Negative for palpitations.  Gastrointestinal:  Positive for nausea. Negative for abdominal distention, abdominal pain, anal bleeding, blood in stool, constipation, diarrhea and vomiting.  Musculoskeletal:  Negative for arthralgias and myalgias.  Skin:  Negative for color change and pallor.  Neurological:  Negative for dizziness, syncope and weakness.  Psychiatric/Behavioral:  Negative for confusion. The patient is not nervous/anxious.   All other systems reviewed and are negative.   Medications Home Medications No current facility-administered medications on file prior to encounter.   Current Outpatient Medications on File Prior to Encounter  Medication Sig Dispense Refill   albuterol (VENTOLIN HFA) 108 (90 Base) MCG/ACT inhaler Inhale 1-2 puffs into the lungs every 6 (six) hours as needed for wheezing  or shortness of breath. 8 g 0   amoxicillin-clavulanate (AUGMENTIN) 875-125 MG tablet Take 1 tablet by mouth 2 (two) times daily. 20 tablet 0   Azelastine HCl 0.15 % SOLN PLACE 1 SPRAY INTO THE NOSE IN THE MORNING AND AT BEDTIME. 30 mL 0   benzonatate (TESSALON) 100 MG capsule Take 1 capsule (100 mg total) by mouth 2 (two) times daily as needed for cough. 20 capsule 0   fluticasone  (FLONASE) 50 MCG/ACT nasal spray Place 2 sprays into both nostrils daily. 16 g 1   levocetirizine (XYZAL) 5 MG tablet TAKE 1 TABLET BY MOUTH EVERY DAY IN THE EVENING 90 tablet 0   predniSONE (STERAPRED UNI-PAK 21 TAB) 10 MG (21) TBPK tablet PO: Take 6 tablets on day 1:Take 5 tablets day 2:Take 4 tablets day 3: Take 3 tablets day 4:Take 2 tablets day five: 5 Take 1 tablet day 6 21 tablet 0   Pertinent GI related medications were reviewed with the patient  Inpatient Medications No current facility-administered medications for this encounter.  Current Outpatient Medications:    albuterol (VENTOLIN HFA) 108 (90 Base) MCG/ACT inhaler, Inhale 1-2 puffs into the lungs every 6 (six) hours as needed for wheezing or shortness of breath., Disp: 8 g, Rfl: 0   amoxicillin-clavulanate (AUGMENTIN) 875-125 MG tablet, Take 1 tablet by mouth 2 (two) times daily., Disp: 20 tablet, Rfl: 0   Azelastine HCl 0.15 % SOLN, PLACE 1 SPRAY INTO THE NOSE IN THE MORNING AND AT BEDTIME., Disp: 30 mL, Rfl: 0   benzonatate (TESSALON) 100 MG capsule, Take 1 capsule (100 mg total) by mouth 2 (two) times daily as needed for cough., Disp: 20 capsule, Rfl: 0   fluticasone (FLONASE) 50 MCG/ACT nasal spray, Place 2 sprays into both nostrils daily., Disp: 16 g, Rfl: 1   levocetirizine (XYZAL) 5 MG tablet, TAKE 1 TABLET BY MOUTH EVERY DAY IN THE EVENING, Disp: 90 tablet, Rfl: 0   predniSONE (STERAPRED UNI-PAK 21 TAB) 10 MG (21) TBPK tablet, PO: Take 6 tablets on day 1:Take 5 tablets day 2:Take 4 tablets day 3: Take 3 tablets day 4:Take 2 tablets day five: 5 Take 1 tablet day 6, Disp: 21 tablet, Rfl: 0      Objective   Vitals:   11/16/20 2111 11/16/20 2112  BP: (!) 151/98   Pulse: 82   Resp: 18   Temp: 98.5 F (36.9 C)   TempSrc: Oral   SpO2: 97%   Weight:  62.6 kg  Height:  5' 8.5" (1.74 m)     Physical Exam Vitals and nursing note reviewed.  Constitutional:      Appearance: Normal appearance. He is not toxic-appearing  or diaphoretic.     Comments: Uncomfortable appearing  HENT:     Head: Normocephalic and atraumatic.     Nose: Nose normal.     Mouth/Throat:     Mouth: Mucous membranes are moist.     Pharynx: Oropharynx is clear.     Comments: Actively spitting into bag Eyes:     General: No scleral icterus.    Extraocular Movements: Extraocular movements intact.  Cardiovascular:     Rate and Rhythm: Normal rate and regular rhythm.     Heart sounds: Normal heart sounds. No murmur heard.   No friction rub. No gallop.  Pulmonary:     Effort: Pulmonary effort is normal. No respiratory distress.     Breath sounds: Normal breath sounds. No wheezing, rhonchi or rales.  Abdominal:  General: Bowel sounds are normal. There is no distension.     Palpations: Abdomen is soft.     Tenderness: There is no abdominal tenderness. There is no guarding or rebound.  Musculoskeletal:     Cervical back: Neck supple.     Right lower leg: No edema.     Left lower leg: No edema.  Skin:    General: Skin is warm and dry.     Coloration: Skin is not jaundiced or pale.  Neurological:     General: No focal deficit present.     Mental Status: He is alert and oriented to person, place, and time. Mental status is at baseline.  Psychiatric:        Mood and Affect: Mood normal.        Behavior: Behavior normal.        Thought Content: Thought content normal.        Judgment: Judgment normal.    Laboratory Data Recent Labs  Lab 11/16/20 2130  WBC 7.7  HGB 15.9  HCT 43.5  PLT 279   Recent Labs  Lab 11/16/20 2130  NA 138  K 3.8  CL 107  CO2 23  BUN 9  CALCIUM 9.0  GLUCOSE 106*       Imaging Studies: DG Chest 2 View  Result Date: 11/16/2020 CLINICAL DATA:  Food impaction, dyspnea EXAM: CHEST - 2 VIEW COMPARISON:  03/18/2020 FINDINGS: Changes of bullous emphysema are again identified with asymmetric extensive bulla formation within the left apex, unchanged from prior examination. No superimposed focal  pulmonary infiltrate. No definite pneumothorax. No pleural effusion. Cardiac size within normal limits. No acute bone abnormality. IMPRESSION: No active cardiopulmonary disease. Stable changes of bullous emphysema, asymmetrically more severe within the left apex. Electronically Signed   By: Helyn Numbers M.D.   On: 11/16/2020 21:45    Assessment:  # Foreign body esophageal obstruction # tobacco dependence # HTN # HLD  Plan:  EGD with possible intervention for food impaction under general anesthesia Labs and imaging from today reviewed No oac anti pl tor nsaid use  Esophagogastroduodenoscopy with possible biopsy, control of bleeding, polypectomy, and interventions as necessary has been discussed with the patient/patient representative. Informed consent was obtained from the patient/patient representative after explaining the indication, nature, and risks of the procedure including but not limited to death, bleeding, perforation, missed neoplasm/lesions, cardiorespiratory compromise, and reaction to medications. Opportunity for questions was given and appropriate answers were provided. Patient/patient representative has verbalized understanding is amenable to undergoing the procedure.   I personally performed the service.  Management of other medical comorbidities as per primary team  Thank you for allowing [place] to participate in this patient's care. Please don't hesitate to call if any questions or concerns arise.   Jaynie Collins, DO [facility]  Portions of the record may have been created with voice recognition software. Occasional wrong-word or 'sound-a-like' substitutions may have occurred due to the inherent limitations of voice recognition software.  Read the chart carefully and recognize, using context, where substitutions may have occurred.

## 2020-11-17 ENCOUNTER — Encounter
Admission: EM | Disposition: A | Discharge status: Home / Self Care | Payer: Self-pay | Source: Home / Self Care | Attending: Emergency Medicine

## 2020-11-17 ENCOUNTER — Encounter: Payer: Self-pay | Admitting: Gastroenterology

## 2020-11-17 ENCOUNTER — Emergency Department: Payer: BC Managed Care – PPO | Admitting: Anesthesiology

## 2020-11-17 HISTORY — PX: ESOPHAGOGASTRODUODENOSCOPY: SHX5428

## 2020-11-17 SURGERY — EGD (ESOPHAGOGASTRODUODENOSCOPY)
Anesthesia: General

## 2020-11-17 MED ORDER — SODIUM CHLORIDE 0.9 % IV SOLN: INTRAVENOUS | Status: DC

## 2020-11-17 MED ORDER — FENTANYL CITRATE PF 50 MCG/ML IJ SOSY: 25.0000 ug | PREFILLED_SYRINGE | INTRAMUSCULAR | Status: DC | PRN

## 2020-11-17 MED ORDER — GLUCAGON HCL RDNA (DIAGNOSTIC) 1 MG IJ SOLR
INTRAMUSCULAR | Status: AC
  Filled 2020-11-17: qty 1

## 2020-11-17 MED ORDER — GLYCOPYRROLATE 0.2 MG/ML IJ SOLN
INTRAMUSCULAR | Status: DC | PRN
  Administered 2020-11-17: .2 mg via INTRAVENOUS

## 2020-11-17 MED ORDER — ONDANSETRON HCL 4 MG/2ML IJ SOLN
INTRAMUSCULAR | Status: DC | PRN
  Administered 2020-11-17: 4 mg via INTRAVENOUS

## 2020-11-17 MED ORDER — LIDOCAINE HCL (CARDIAC) PF 100 MG/5ML IV SOSY
PREFILLED_SYRINGE | INTRAVENOUS | Status: DC | PRN
  Administered 2020-11-17: 80 mg via INTRAVENOUS

## 2020-11-17 MED ORDER — PANTOPRAZOLE SODIUM 40 MG PO TBEC: 40.0000 mg | DELAYED_RELEASE_TABLET | Freq: Two times a day (BID) | ORAL | 1 refills | Status: DC

## 2020-11-17 MED ORDER — GLUCAGON HCL RDNA (DIAGNOSTIC) 1 MG IJ SOLR
INTRAMUSCULAR | Status: DC | PRN
  Administered 2020-11-17: .5 mL via INTRAVENOUS

## 2020-11-17 MED ORDER — PROPOFOL 10 MG/ML IV BOLUS
INTRAVENOUS | Status: DC | PRN
  Administered 2020-11-17: 50 mg via INTRAVENOUS
  Administered 2020-11-17: 150 mg via INTRAVENOUS

## 2020-11-17 MED ORDER — SUCCINYLCHOLINE CHLORIDE 200 MG/10ML IV SOSY
PREFILLED_SYRINGE | INTRAVENOUS | Status: DC | PRN
  Administered 2020-11-17: 100 mg via INTRAVENOUS

## 2020-11-17 MED ORDER — EPHEDRINE 5 MG/ML INJ
INTRAVENOUS | Status: AC
  Filled 2020-11-17: qty 5

## 2020-11-17 MED ORDER — LACTATED RINGERS IV SOLN: INTRAVENOUS | Status: DC | PRN

## 2020-11-17 MED ORDER — DEXAMETHASONE SODIUM PHOSPHATE 10 MG/ML IJ SOLN
INTRAMUSCULAR | Status: DC | PRN
  Administered 2020-11-17: 10 mg via INTRAVENOUS

## 2020-11-17 MED ORDER — EPHEDRINE SULFATE 50 MG/ML IJ SOLN
INTRAMUSCULAR | Status: DC | PRN
  Administered 2020-11-17: 5 mg via INTRAVENOUS

## 2020-11-17 MED ORDER — LIDOCAINE HCL (PF) 2 % IJ SOLN
INTRAMUSCULAR | Status: AC
  Filled 2020-11-17: qty 5

## 2020-11-17 MED ORDER — PHENYLEPHRINE HCL (PRESSORS) 10 MG/ML IV SOLN
INTRAVENOUS | Status: DC | PRN
  Administered 2020-11-17 (×2): 100 ug via INTRAVENOUS
  Administered 2020-11-17: 200 ug via INTRAVENOUS
  Administered 2020-11-17: 100 ug via INTRAVENOUS

## 2020-11-17 MED ORDER — PROPOFOL 10 MG/ML IV BOLUS
INTRAVENOUS | Status: AC
  Filled 2020-11-17: qty 20

## 2020-11-17 MED ORDER — SUCCINYLCHOLINE CHLORIDE 200 MG/10ML IV SOSY
PREFILLED_SYRINGE | INTRAVENOUS | Status: AC
  Filled 2020-11-17: qty 10

## 2020-11-17 NOTE — Transfer of Care (Signed)
Immediate Anesthesia Transfer of Care Note  Patient: Kyle Clark  Procedure(s) Performed: ESOPHAGOGASTRODUODENOSCOPY (EGD) with possible intervention  Patient Location: PACU  Anesthesia Type:General  Level of Consciousness: awake, alert  and oriented  Airway & Oxygen Therapy: Patient Spontanous Breathing and Patient connected to face mask oxygen  Post-op Assessment: Report given to RN and Post -op Vital signs reviewed and stable  Post vital signs: Reviewed and stable  Last Vitals:  Vitals Value Taken Time  BP 147/85 11/17/20 0330  Temp 36.9 C 11/17/20 0327  Pulse 101 11/17/20 0332  Resp 22 11/17/20 0332  SpO2 100 % 11/17/20 0332  Vitals shown include unvalidated device data.  Last Pain:  Vitals:   11/17/20 0327  TempSrc:   PainSc: 0-No pain         Complications: No notable events documented.

## 2020-11-17 NOTE — Op Note (Signed)
[facility]astroenterology Patient Name: Kyle Clark Procedure Date: 11/17/2020 1:24 AM MRN: [id number] Account #: [id number] Date of Birth: 24-Aug-1970 Admit Type: Outpatient Age: 50 Room: [facility] ENDO ROOM 4 Gender: Male Note Status: Finalized Instrument Name: Upper Endoscope [id number] Procedure:             Upper GI endoscopy Indications:           Removal of foreign body in the esophagus Providers:             Jaynie Collins DO, DO Referring MD:          No Local Md, MD (Referring MD) Medicines:             General Anesthesia Complications:         No immediate complications. Estimated blood loss:                         Minimal. Procedure:             Pre-Anesthesia Assessment:                        - Prior to the procedure, a History and Physical was                         performed, and patient medications and allergies were                         reviewed. The patient is competent. The risks and                         benefits of the procedure and the sedation options and                         risks were discussed with the patient. All questions                         were answered and informed consent was obtained.                         Patient identification and proposed procedure were                         verified by the physician, the nurse, the                         anesthesiologist, the anesthetist and the technician                         in the endoscopy suite. Mental Status Examination:                         alert and oriented. Airway Examination: normal                         oropharyngeal airway and neck mobility. Respiratory                         Examination: clear to auscultation. CV Examination:  RRR, no murmurs, no S3 or S4. Prophylactic                         Antibiotics: The patient does not require prophylactic                         antibiotics. Prior Anticoagulants: The patient has                          taken no previous anticoagulant or antiplatelet                         agents. ASA Grade Assessment: E - Emergency. After                         reviewing the risks and benefits, the patient was                         deemed in satisfactory condition to undergo the                         procedure. The anesthesia plan was to use general                         anesthesia. Immediately prior to administration of                         medications, the patient was re-assessed for adequacy                         to receive sedatives. The heart rate, respiratory                         rate, oxygen saturations, blood pressure, adequacy of                         pulmonary ventilation, and response to care were                         monitored throughout the procedure. The physical                         status of the patient was re-assessed after the                         procedure.                        After obtaining informed consent, the endoscope was                         passed under direct vision. Throughout the procedure,                         the patient's blood pressure, pulse, and oxygen                         saturations were monitored continuously. The Endoscope  was introduced through the mouth, and advanced to the                         second part of duodenum. The upper GI endoscopy was                         accomplished without difficulty. The patient tolerated                         the procedure well. Findings:      Food was found in the mid esophagus, in the distal esophagus and at the       gastroesophageal junction (42 cm from incisors). Removal of food was       accomplished with grasper, 3 prong device, and roth net. Biopsies were       taken with a cold forceps for histology to rule out EOE. Estimated blood       loss was minimal.      LA Grade C (one or more mucosal breaks continuous between tops of 2 or        more mucosal folds, less than 75% circumference) esophagitis with no       bleeding was found 42 cm from the incisors. Estimated blood loss: none.       No dilation performed due to active inflammation.      A medium amount of food (residue) was found in the gastric body.       Estimated blood loss: none. This limited visualization of the gastric       cardia and body. As much as possible liquid material was suctioned out.      The duodenal bulb, first portion of the duodenum and second portion of       the duodenum were normal. Estimated blood loss: none. Impression:            - Food at the gastroesophageal junction. Biopsied.                         Removal was successful.                        - LA Grade C reflux esophagitis with no bleeding.                        - A medium amount of food (residue) in the stomach.                        - Normal duodenal bulb, first portion of the duodenum                         and second portion of the duodenum. Recommendation:        - Discharge patient to home.                        - Soft diet for 1 day. Moisten and chew food well.                         Chew slowly. Avoid dry foods.                        -  Continue present medications. Avoid non-steroidal                         anti inflammatories such as advil, ibuprofen, aleve,                         naproxyn, goody powders, bc powders.                        - Await pathology results.                        - Repeat upper endoscopy in 8-12 weeks to check                         healing. If don't hear from scheduling within 1 week,                         call office at [phone].                        - Use Protonix (pantoprazole) 40 mg PO BID for 12                         weeks. To be sent to your pharmacy.                        - avoid tobacco and alcohol products Procedure Code(s):     --- Professional ---                        [phone], Esophagogastroduodenoscopy,  flexible,                         transoral; with removal of foreign body(s)                        43239, Esophagogastroduodenoscopy, flexible,                         transoral; with biopsy, single or multiple Diagnosis Code(s):     --- Professional ---                        O03.704U, Food in esophagus causing other injury,                         initial encounter                        K21.00, Gastro-esophageal reflux disease with                         esophagitis, without bleeding                        T18.2XXA, Foreign body in stomach, initial encounter                        T18.108A, Unspecified foreign body in esophagus  causing other injury, initial encounter CPT copyright 2019 American Medical Association. All rights reserved. The codes documented in this report are preliminary and upon coder review may  be revised to meet current compliance requirements. Attending Participation:      I personally performed the entire procedure. Elfredia Nevins, DO Jaynie Collins DO, DO 11/17/2020 3:36:50 AM This report has been signed electronically. Number of Addenda: 0 Note Initiated On: 11/17/2020 1:24 AM Estimated Blood Loss:  Estimated blood loss was minimal.      [facility]

## 2020-11-17 NOTE — Anesthesia Preprocedure Evaluation (Signed)
Anesthesia Evaluation  Patient identified by MRN, date of birth, ID band Patient awake    Reviewed: Allergy & Precautions, H&P , NPO status , Patient's Chart, lab work & pertinent test results, reviewed documented beta blocker date and time   History of Anesthesia Complications Negative for: history of anesthetic complications  Airway Mallampati: II  TM Distance: >3 FB Neck ROM: full    Dental  (+) Dental Advidsory Given, Teeth Intact   Pulmonary neg shortness of breath, neg COPD, neg recent URI, Current Smoker,    Pulmonary exam normal breath sounds clear to auscultation       Cardiovascular Exercise Tolerance: Good hypertension, (-) angina(-) Past MI and (-) Cardiac Stents Normal cardiovascular exam(-) dysrhythmias (-) Valvular Problems/Murmurs Rhythm:regular Rate:Normal     Neuro/Psych negative neurological ROS  negative psych ROS   GI/Hepatic negative GI ROS, Neg liver ROS,   Endo/Other  negative endocrine ROS  Renal/GU negative Renal ROS  negative genitourinary   Musculoskeletal   Abdominal   Peds  Hematology negative hematology ROS (+)   Anesthesia Other Findings Past Medical History: No date: Allergic rhinitis No date: Hyperlipidemia No date: Hypertension   Reproductive/Obstetrics negative OB ROS                             Anesthesia Physical Anesthesia Plan  ASA: 2  Anesthesia Plan: General   Post-op Pain Management:    Induction: Intravenous, Rapid sequence and Cricoid pressure planned  PONV Risk Score and Plan: 1 and Ondansetron, Dexamethasone, Midazolam and Treatment may vary due to age or medical condition  Airway Management Planned: Oral ETT  Additional Equipment:   Intra-op Plan:   Post-operative Plan: Extubation in OR  Informed Consent: I have reviewed the patients History and Physical, chart, labs and discussed the procedure including the risks, benefits  and alternatives for the proposed anesthesia with the patient or authorized representative who has indicated his/her understanding and acceptance.     Dental Advisory Given  Plan Discussed with: Anesthesiologist, CRNA and Surgeon  Anesthesia Plan Comments:         Anesthesia Quick Evaluation

## 2020-11-17 NOTE — Anesthesia Procedure Notes (Signed)
Procedure Name: Intubation Date/Time: 11/17/2020 1:29 AM Performed by: Waldo Laine, CRNA Pre-anesthesia Checklist: Emergency Drugs available Patient Re-evaluated:Patient Re-evaluated prior to induction Oxygen Delivery Method: Circle system utilized Preoxygenation: Pre-oxygenation with 100% oxygen Induction Type: IV induction, Rapid sequence and Cricoid Pressure applied Laryngoscope Size: 3 and McGraph Grade View: Grade I Tube type: Oral Tube size: 7.5 mm Number of attempts: 1 Airway Equipment and Method: Stylet Placement Confirmation: ETT inserted through vocal cords under direct vision, positive ETCO2 and breath sounds checked- equal and bilateral Secured at: 21 cm Tube secured with: Tape Dental Injury: Teeth and Oropharynx as per pre-operative assessment

## 2020-11-17 NOTE — ED Notes (Signed)
All pts clothing removed as well as jewelery and placed into personal belongings bag and given to pt spouse. ENDO RN with wheelchair to ED to take pt. Consent paper (Not filled out) and chart given to RN.

## 2020-11-17 NOTE — Interval H&P Note (Signed)
History and Physical Interval Note: Preprocedure Consult Note from 11/16/20  was reviewed and there was no interval change after seeing and examining the patient.  Written consent was obtained from the patient after discussion of risks, benefits, and alternatives. Patient has consented to proceed with Esophagogastroduodenoscopy with possible intervention   11/17/2020 12:26 AM  Kyle Clark  has presented today for surgery, with the diagnosis of food impaction.  The various methods of treatment have been discussed with the patient and family. After consideration of risks, benefits and other options for treatment, the patient has consented to  Procedure(s) with comments: ESOPHAGOGASTRODUODENOSCOPY (EGD) with possible intervention (N/A) - food impaction as a surgical intervention.  The patient's history has been reviewed, patient examined, no change in status, stable for surgery.  I have reviewed the patient's chart and labs.  Questions were answered to the patient's satisfaction.     Jaynie Collins

## 2020-11-18 ENCOUNTER — Encounter: Payer: Self-pay | Admitting: Gastroenterology

## 2020-11-18 LAB — SURGICAL PATHOLOGY

## 2020-11-22 NOTE — Anesthesia Postprocedure Evaluation (Signed)
Anesthesia Post Note  Patient: Kyle Clark  Procedure(s) Performed: ESOPHAGOGASTRODUODENOSCOPY (EGD) with possible intervention  Patient location during evaluation: Endoscopy Anesthesia Type: General Level of consciousness: awake and alert Pain management: pain level controlled Vital Signs Assessment: post-procedure vital signs reviewed and stable Respiratory status: spontaneous breathing, nonlabored ventilation, respiratory function stable and patient connected to nasal cannula oxygen Cardiovascular status: blood pressure returned to baseline and stable Postop Assessment: no apparent nausea or vomiting Anesthetic complications: no   No notable events documented.   Last Vitals:  Vitals:   11/17/20 0345 11/17/20 0350  BP: (!) 145/103 (!) 142/98  Pulse: (!) 102 97  Resp: 15 17  Temp: (!) 36.3 C   SpO2: 96% 94%    Last Pain:  Vitals:   11/17/20 0350  TempSrc:   PainSc: 0-No pain                 Lenard Simmer

## 2021-02-24 ENCOUNTER — Ambulatory Visit
Admission: RE | Admit: 2021-02-24 | Payer: BC Managed Care – PPO | Source: Ambulatory Visit | Admitting: Gastroenterology

## 2021-02-24 ENCOUNTER — Encounter: Admission: RE | Payer: Self-pay | Source: Ambulatory Visit

## 2021-02-24 SURGERY — EGD (ESOPHAGOGASTRODUODENOSCOPY)
Anesthesia: General

## 2021-06-30 ENCOUNTER — Other Ambulatory Visit: Payer: Self-pay

## 2021-06-30 ENCOUNTER — Telehealth: Payer: Self-pay | Admitting: Family Medicine

## 2021-06-30 NOTE — Telephone Encounter (Signed)
[facility] faxed refill request for the following medications: ? ?Azelastine HCl 0.15 % SOLN  ? ?Please advise. ? ?

## 2021-11-09 ENCOUNTER — Ambulatory Visit
Admission: RE | Admit: 2021-11-09 | Discharge: 2021-11-09 | Disposition: A | Discharge status: Home / Self Care | Payer: BC Managed Care – PPO | Attending: Physician Assistant | Admitting: Physician Assistant

## 2021-11-09 ENCOUNTER — Encounter: Payer: Self-pay | Admitting: Physician Assistant

## 2021-11-09 ENCOUNTER — Ambulatory Visit
Admission: RE | Admit: 2021-11-09 | Discharge: 2021-11-09 | Disposition: A | Discharge status: Home / Self Care | Payer: BC Managed Care – PPO | Source: Ambulatory Visit | Attending: Physician Assistant | Admitting: Physician Assistant

## 2021-11-09 ENCOUNTER — Ambulatory Visit: Payer: BC Managed Care – PPO | Admitting: Physician Assistant

## 2021-11-09 VITALS — BP 127/82 | HR 92 | Temp 98.2°F | Resp 16 | Wt 166.0 lb

## 2021-11-09 DIAGNOSIS — R062 Wheezing: Secondary | ICD-10-CM | POA: Insufficient documentation

## 2021-11-09 DIAGNOSIS — R053 Chronic cough: Secondary | ICD-10-CM | POA: Diagnosis present

## 2021-11-09 DIAGNOSIS — J309 Allergic rhinitis, unspecified: Secondary | ICD-10-CM | POA: Insufficient documentation

## 2021-11-09 DIAGNOSIS — J324 Chronic pansinusitis: Secondary | ICD-10-CM

## 2021-11-09 MED ORDER — ALBUTEROL SULFATE HFA 108 (90 BASE) MCG/ACT IN AERS: 1.0000 | INHALATION_SPRAY | Freq: Four times a day (QID) | RESPIRATORY_TRACT | 0 refills | Status: DC | PRN

## 2021-11-09 NOTE — Progress Notes (Signed)
I,Sulibeya S Dimas,acting as a Neurosurgeon for [employer], PA-C.,have documented all relevant documentation on the behalf of Debera Lat, PA-C,as directed by  [employer], PA-C while in the presence of [employer], PA-C.   Established patient visit   Patient: Kyle Clark   DOB: 1970-12-28   51 y.o. Male  MRN: [id number] Visit Date: 11/09/2021  Today's healthcare provider: Debera Lat, PA-C   Chief Complaint  Patient presents with   URI   Subjective      Upper respiratory symptoms He complains of congestion, nasal congestion, post nasal drip, productive cough with  yellow colored sputum, shortness of breath, sinus pressure, and wheezing.with no fever, chills, night sweats or weight loss. Onset of symptoms was about a month ago and staying constant.He is drinking plenty of fluids.  Past history is significant for  allergic rhinitis, and chronic sinusitis . Patient is former smoker, quit 01/21 years ago. Patient does vape. Patient reports he saw ENT about 2 years ago. He was started on montelukast.    ---------------------------------------------------------------------------------------------------   Medications: Outpatient Medications Prior to Visit  Medication Sig   Azelastine HCl 0.15 % SOLN PLACE 1 SPRAY INTO THE NOSE IN THE MORNING AND AT BEDTIME.   cetirizine (ZYRTEC) 10 MG chewable tablet Chew 10 mg by mouth daily.   levocetirizine (XYZAL) 5 MG tablet TAKE 1 TABLET BY MOUTH EVERY DAY IN THE EVENING   montelukast (SINGULAIR) 10 MG tablet Take 10 mg by mouth at bedtime.   pantoprazole (PROTONIX) 40 MG tablet Take 1 tablet (40 mg total) by mouth 2 (two) times daily. Take 30 minutes before breakfast and supper   albuterol (VENTOLIN HFA) 108 (90 Base) MCG/ACT inhaler Inhale 1-2 puffs into the lungs every 6 (six) hours as needed for wheezing or shortness of breath. (Patient not taking: Reported on 11/09/2021)   [DISCONTINUED] benzonatate (TESSALON) 100 MG capsule Take  1 capsule (100 mg total) by mouth 2 (two) times daily as needed for cough.   [DISCONTINUED] fluticasone (FLONASE) 50 MCG/ACT nasal spray Place 2 sprays into both nostrils daily.   No facility-administered medications prior to visit.    Review of Systems  Constitutional:  Negative for appetite change, chills and fever.  HENT:  Positive for congestion, postnasal drip, sinus pressure and sinus pain. Negative for ear pain.   Respiratory:  Positive for cough, shortness of breath and wheezing.   Cardiovascular:  Negative for chest pain and palpitations.       Objective    BP 127/82 (BP Location: Left Arm, Patient Position: Sitting, Cuff Size: Large)   Pulse 92   Temp 98.2 F (36.8 C) (Oral)   Resp 16   Wt 166 lb (75.3 kg)   SpO2 97%   BMI 24.87 kg/m  BP Readings from Last 3 Encounters:  11/09/21 127/82  11/17/20 (!) 142/98  07/08/19 131/76   Wt Readings from Last 3 Encounters:  11/09/21 166 lb (75.3 kg)  11/16/20 138 lb (62.6 kg)  02/03/20 146 lb (66.2 kg)      Physical Exam Vitals reviewed.  Constitutional:      General: He is not in acute distress.    Appearance: Normal appearance. He is not diaphoretic.  HENT:     Head: Normocephalic and atraumatic.  Eyes:     General: No scleral icterus.    Conjunctiva/sclera: Conjunctivae normal.  Cardiovascular:     Rate and Rhythm: Normal rate and regular rhythm.     Pulses: Normal pulses.  Heart sounds: Normal heart sounds. No murmur heard. Pulmonary:     Effort: Pulmonary effort is normal. No respiratory distress.     Breath sounds: Wheezing present.  Musculoskeletal:     Cervical back: Neck supple.     Right lower leg: No edema.     Left lower leg: No edema.  Lymphadenopathy:     Cervical: No cervical adenopathy.  Skin:    General: Skin is warm and dry.     Findings: No rash.  Neurological:     Mental Status: He is alert and oriented to person, place, and time. Mental status is at baseline.  Psychiatric:         Mood and Affect: Mood normal.        Behavior: Behavior normal.      No results found for any visits on 11/09/21.  Assessment & Plan     1. Chronic pansinusitis Symptomatic treatment via saline nasal rinse, hydration adequate, flonase, warm salt gargles, cough expectorant , tenting, air humidifier Former smoker, 12.5 pack-years - DG Chest 2 View; Future - Ambulatory referral to Pulmonology  2. Wheezing - albuterol (VENTOLIN HFA) 108 (90 Base) MCG/ACT inhaler; Inhale 1-2 puffs into the lungs every 6 (six) hours as needed for wheezing or shortness of breath.  Dispense: 8 g; Refill: 0 Needs maintenance inhaler - DG Chest 2 View; Future - Ambulatory referral to Pulmonology  3. Allergic rhinitis, unspecified seasonality, unspecified trigger Advised antihistamines, saline nasal rinse and  - DG Chest 2 View; Future - Ambulatory referral to Allergy - Ambulatory referral to Pulmonology  4. Chronic cough Could be to chronic bronchitis Former smoker, 12.5 pack-years - DG Chest 2 View; Future - Ambulatory referral to Pulmonology   FU as scheduled  The patient was advised to call back or seek an in-person evaluation if the symptoms worsen or if the condition fails to improve as anticipated.  I discussed the assessment and treatment plan with the patient. The patient was provided an opportunity to ask questions and all were answered. The patient agreed with the plan and demonstrated an understanding of the instructions.  The entirety of the information documented in the History of Present Illness, Review of Systems and Physical Exam were personally obtained by me. Portions of this information were initially documented by the CMA and reviewed by me for thoroughness and accuracy.  Portions of this note were created using dictation software and may contain typographical errors.     Debera Lat, PA-C  [facility] [phone] (phone) [phone] (fax)  [facility]

## 2021-11-10 NOTE — Progress Notes (Signed)
Hello Kyle Clark ,   Your CXR results are back and showed no acute process.  Our scheduler will contact you soon.  Any questions please reach out to the office or message me on MyChart!  Best, Debera Lat, PA-C

## 2021-11-11 ENCOUNTER — Encounter: Payer: Self-pay | Admitting: Physician Assistant

## 2021-11-25 ENCOUNTER — Other Ambulatory Visit: Payer: Self-pay | Admitting: Physician Assistant

## 2021-11-25 ENCOUNTER — Telehealth: Payer: Self-pay | Admitting: Physician Assistant

## 2021-11-25 DIAGNOSIS — J324 Chronic pansinusitis: Secondary | ICD-10-CM

## 2021-11-25 MED ORDER — LEVOCETIRIZINE DIHYDROCHLORIDE 5 MG PO TABS: ORAL_TABLET | ORAL | 0 refills | Status: DC

## 2021-11-25 NOTE — Telephone Encounter (Signed)
[facility] faxed refill request for the following medications:  levocetirizine (XYZAL) 5 MG tablet   montelukast (SINGULAIR) 10 MG tablet   Please advise.

## 2021-11-25 NOTE — Telephone Encounter (Signed)
Last office visit:11/09/2021 No future OV scheduled  Xyzal Last refill: 09/30/2020 #90 with 0 refills  Cetirizine- historical medication

## 2021-11-28 ENCOUNTER — Other Ambulatory Visit: Payer: Self-pay

## 2021-11-28 DIAGNOSIS — J324 Chronic pansinusitis: Secondary | ICD-10-CM

## 2021-11-28 MED ORDER — LEVOCETIRIZINE DIHYDROCHLORIDE 5 MG PO TABS: ORAL_TABLET | ORAL | 3 refills | Status: DC

## 2021-11-28 MED ORDER — MONTELUKAST SODIUM 10 MG PO TABS: 10.0000 mg | ORAL_TABLET | Freq: Every day | ORAL | 3 refills | Status: DC

## 2021-11-30 ENCOUNTER — Ambulatory Visit (INDEPENDENT_AMBULATORY_CARE_PROVIDER_SITE_OTHER): Payer: BC Managed Care – PPO | Admitting: Student in an Organized Health Care Education/Training Program

## 2021-11-30 ENCOUNTER — Encounter: Payer: Self-pay | Admitting: Student in an Organized Health Care Education/Training Program

## 2021-11-30 VITALS — BP 138/92 | HR 96 | Temp 97.8°F | Ht 68.0 in | Wt 163.2 lb

## 2021-11-30 DIAGNOSIS — J438 Other emphysema: Secondary | ICD-10-CM | POA: Diagnosis not present

## 2021-11-30 NOTE — Progress Notes (Signed)
Synopsis: Referred in for shortness of breath by Debera Lat, PA-C  Assessment & Plan:   #Shortness of Breath #Emphysema  Presents for the evaluation of shortness of breath and occasional cough. Suspect the cough is secondary to his continued nasal drainage from his sinusitis. He will continue to follow up with ENT. Review of his blood work is notable for an eosinophil count of 400. COPD/emphysema is highest on the differential given his long standing history of smoking. I don't suspect asthma/COPD overlap but it will remain on my differential until asthma is ruled out.  I have discussed allergen testing and IgE level measurement with Kyle Clark and he will first go through with allergy testing at the [facility] office. Furthermore, given CXR showed emphysema and given shortness of breath, I will proceed with pulmonary function testing (spirometry, lung volumes, DLCO). I will consider methacholine challenge testing in the future should PFT's not be commensurate with symptoms. I did discuss with him that should they show COPD, he would benefit from a long acting inhaler. I will also consider alpha-1-antitrypsin testing following PFT's. I discussed flu vaccination today with Kyle Clark and he would like to defer - I will revisit this on follow up.  - Pulmonary Function Test [facility] Only; Future   Return in about 2 months (around 01/30/2022).  I spent 45 minutes caring for this patient today, including preparing to see the patient, obtaining and/or reviewing separately obtained history, performing a medically appropriate examination and/or evaluation, counseling and educating the patient/family/caregiver, ordering medications, tests, or procedures, and documenting clinical information in the electronic health record  Raechel Chute, MD [facility] 11/30/2021 5:53 PM    End of visit medications:  No orders of the defined types were placed in this encounter.    Current  Outpatient Medications:    albuterol (VENTOLIN HFA) 108 (90 Base) MCG/ACT inhaler, Inhale 1-2 puffs into the lungs every 6 (six) hours as needed for wheezing or shortness of breath., Disp: 8 g, Rfl: 0   Azelastine HCl 0.15 % SOLN, PLACE 1 SPRAY INTO THE NOSE IN THE MORNING AND AT BEDTIME., Disp: 30 mL, Rfl: 0   levocetirizine (XYZAL) 5 MG tablet, TAKE 1 TABLET BY MOUTH EVERY DAY IN THE EVENING, Disp: 90 tablet, Rfl: 3   montelukast (SINGULAIR) 10 MG tablet, Take 1 tablet (10 mg total) by mouth at bedtime., Disp: 90 tablet, Rfl: 3   pantoprazole (PROTONIX) 40 MG tablet, TAKE 1 TABLET (40 MG TOTAL) BY MOUTH TWICE A DAY BEFORE MEALS, Disp: , Rfl:    pantoprazole (PROTONIX) 40 MG tablet, Take 1 tablet (40 mg total) by mouth 2 (two) times daily. Take 30 minutes before breakfast and supper, Disp: 180 tablet, Rfl: 1   Subjective:   PATIENT ID: Kyle Clark GENDER: male DOB: [birth year], MRN: [id number]  Chief Complaint  Patient presents with   Consult    Chronic cough from sinus drainage (seeing ENT).  PCP says he has emphysema from cxr.  Sob every once in a while if it is really hot and humid.    HPI  Kyle Clark is a pleasant 51 year old clinic for the evaluation of shortness of breath.  Patient reports that his symptoms mostly consist of shortness of breath that is occasional, sudden in onset and not associated with other symptoms.  He denies any associated wheezing, cough, sputum production, hemoptysis, chest pain, chest tightness, fevers, or chills. He does not have a past medical history of asthma and denies any history  of having recurrent lung infections/ailments. This week, he has not had any symptoms and feels well overall.  Last week, he experienced some shortness of breath and attributed it to the humid weather.  He has not had any recent upper respiratory tract infections.  He carries a diagnosis of emphysema 2 years ago that was noted on a chest x-ray.  He had been a long-term smoker  (started smoking at the age of 66, quit 1 year and 8 months ago) and that x-ray diagnosis prompted him to quit.  He was given albuterol by his primary care provider and he has not felt any improvement with it.  He does have a longstanding history of recurrent rhinitis and sinusitis for which he is now seeing ENT.  He is to be evaluated by his ENT provider next week whereby he will also have allergy testing.  He feels a continuous need to clear his throat/cough secondary to continued drainage from his sinuses.  He tells me that he was started on montelukast because of his allergies.  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 He does not have any pets. No report of mold at home. No occupational exposures noted.  Ancillary information including prior medications, full medical/surgical/family/social histories, and PFTs (when available) are listed below and have been reviewed.   Review of Systems  Constitutional:  Negative for chills, diaphoresis, fever, malaise/fatigue and weight loss.  HENT:  Positive for congestion. Negative for nosebleeds and sore throat.   Eyes:  Negative for blurred vision.  Respiratory:  Positive for cough and shortness of breath. Negative for hemoptysis, sputum production, wheezing and stridor.   Cardiovascular:  Negative for chest pain, palpitations, orthopnea, leg swelling and PND.  Neurological:  Negative for dizziness and weakness.     Objective:   Vitals:   11/30/21 1557  BP: (!) 138/92  Pulse: 96  Temp: 97.8 F (36.6 C)  TempSrc: Oral  SpO2: 97%  Weight: 163 lb 3.2 oz (74 kg)  Height: 5\' 8"  (1.727 m)   97% on RA BMI Readings from Last 3 Encounters:  11/30/21 24.81 kg/m  11/09/21 24.87 kg/m  11/16/20 20.68 kg/m   Wt Readings from Last 3 Encounters:  11/30/21 163 lb 3.2 oz (74 kg)  11/09/21 166 lb (75.3 kg)  11/16/20 138 lb (62.6 kg)    Physical Exam Constitutional:      Appearance: Normal appearance.  HENT:     Head: Normocephalic.      Mouth/Throat:     Mouth: Mucous membranes are moist.  Eyes:     Pupils: Pupils are equal, round, and reactive to light.  Cardiovascular:     Rate and Rhythm: Normal rate and regular rhythm.     Pulses: Normal pulses.     Heart sounds: Normal heart sounds.  Pulmonary:     Effort: Pulmonary effort is normal.     Breath sounds: Normal breath sounds.  Abdominal:     General: Abdomen is flat.     Palpations: Abdomen is soft.  Musculoskeletal:        General: Normal range of motion.     Cervical back: Normal range of motion.  Skin:    General: Skin is warm.  Neurological:     General: No focal deficit present.     Mental Status: He is alert and oriented to person, place, and time.       Ancillary Information    Past Medical History:  Diagnosis Date   Allergic rhinitis  Hyperlipidemia    Hypertension      Family History  Problem Relation Age of Onset   Healthy Mother    Congestive Heart Failure Father    Healthy Sister    Healthy Daughter      Past Surgical History:  Procedure Laterality Date   ESOPHAGOGASTRODUODENOSCOPY N/A 11/17/2020   Procedure: ESOPHAGOGASTRODUODENOSCOPY (EGD) with possible intervention;  Surgeon: Jaynie Collinsusso, Steven Michael, DO;  [facility];  Service: Gastroenterology;  Laterality: N/A;  food impaction   NO PAST SURGERIES      Social History   Socioeconomic History   Marital status: Married    Spouse name: Not on file   Number of children: Not on file   Years of education: Not on file   Highest education level: Not on file  Occupational History   Not on file  Tobacco Use   Smoking status: Former    Packs/day: 1.00    Years: 25.00    Total pack years: 25.00    Types: Cigarettes    Quit date: 03/14/2019    Years since quitting: 2.7   Smokeless tobacco: Never  Vaping Use   Vaping Use: Every day  Substance and Sexual Activity   Alcohol use: Yes    Comment: occasional alcohol use, drinks beer on weekends maybe once a week    Drug use: No   Sexual activity: Not on file  Other Topics Concern   Not on file  Social History Narrative   Not on file   Social Determinants of Health   Financial Resource Strain: Not on file  Food Insecurity: Not on file  Transportation Needs: Not on file  Physical Activity: Not on file  Stress: Not on file  Social Connections: Not on file  Intimate Partner Violence: Not on file     Allergies  Allergen Reactions   Lisinopril Palpitations    Other reaction(s): Rapid pulse High blood pressure      CBC    Component Value Date/Time   WBC 7.7 11/16/2020 2130   RBC 5.10 11/16/2020 2130   HGB 15.9 11/16/2020 2130   HGB 17.8 (H) 05/15/2017 0806   HCT 43.5 11/16/2020 2130   HCT 50.2 05/15/2017 0806   PLT 279 11/16/2020 2130   PLT 248 05/15/2017 0806   MCV 85.3 11/16/2020 2130   MCV 87 05/15/2017 0806   MCH 31.2 11/16/2020 2130   MCHC 36.6 (H) 11/16/2020 2130   RDW 11.2 (L) 11/16/2020 2130   RDW 12.7 05/15/2017 0806   LYMPHSABS 3.2 11/16/2020 2130   LYMPHSABS 3.9 (H) 05/15/2017 0806   MONOABS 0.5 11/16/2020 2130   EOSABS 0.4 11/16/2020 2130   EOSABS 0.2 05/15/2017 0806   BASOSABS 0.1 11/16/2020 2130   BASOSABS 0.0 05/15/2017 0806    Pulmonary Functions Testing Results:     No data to display          Outpatient Medications Prior to Visit  Medication Sig Dispense Refill   albuterol (VENTOLIN HFA) 108 (90 Base) MCG/ACT inhaler Inhale 1-2 puffs into the lungs every 6 (six) hours as needed for wheezing or shortness of breath. 8 g 0   Azelastine HCl 0.15 % SOLN PLACE 1 SPRAY INTO THE NOSE IN THE MORNING AND AT BEDTIME. 30 mL 0   levocetirizine (XYZAL) 5 MG tablet TAKE 1 TABLET BY MOUTH EVERY DAY IN THE EVENING 90 tablet 3   montelukast (SINGULAIR) 10 MG tablet Take 1 tablet (10 mg total) by mouth at bedtime. 90 tablet 3  pantoprazole (PROTONIX) 40 MG tablet TAKE 1 TABLET (40 MG TOTAL) BY MOUTH TWICE A DAY BEFORE MEALS     pantoprazole (PROTONIX) 40 MG tablet Take  1 tablet (40 mg total) by mouth 2 (two) times daily. Take 30 minutes before breakfast and supper 180 tablet 1   No facility-administered medications prior to visit.

## 2021-12-06 ENCOUNTER — Other Ambulatory Visit: Payer: Self-pay | Admitting: Physician Assistant

## 2021-12-06 DIAGNOSIS — R062 Wheezing: Secondary | ICD-10-CM

## 2022-01-19 ENCOUNTER — Ambulatory Visit: Payer: BC Managed Care – PPO | Attending: Physician Assistant

## 2022-01-19 DIAGNOSIS — Z87891 Personal history of nicotine dependence: Secondary | ICD-10-CM | POA: Diagnosis not present

## 2022-01-19 DIAGNOSIS — J449 Chronic obstructive pulmonary disease, unspecified: Secondary | ICD-10-CM | POA: Diagnosis present

## 2022-01-19 DIAGNOSIS — J438 Other emphysema: Secondary | ICD-10-CM | POA: Diagnosis not present

## 2022-01-19 DIAGNOSIS — R059 Cough, unspecified: Secondary | ICD-10-CM | POA: Diagnosis not present

## 2022-01-19 DIAGNOSIS — R0609 Other forms of dyspnea: Secondary | ICD-10-CM | POA: Insufficient documentation

## 2022-01-19 LAB — PULMONARY FUNCTION TEST ARMC ONLY
DL/VA % pred: 63 %
DL/VA: 2.82 ml/min/mmHg/L
DLCO unc % pred: 60 %
DLCO unc: 16.7 ml/min/mmHg
FEF 25-75 Post: 1.26 L/sec
FEF 25-75 Pre: 0.85 L/sec
FEF2575-%Change-Post: 47 %
FEF2575-%Pred-Post: 38 %
FEF2575-%Pred-Pre: 25 %
FEV1-%Change-Post: 16 %
FEV1-%Pred-Post: 59 %
FEV1-%Pred-Pre: 50 %
FEV1-Post: 2.18 L
FEV1-Pre: 1.87 L
FEV1FVC-%Change-Post: 6 %
FEV1FVC-%Pred-Pre: 70 %
FEV6-%Change-Post: 10 %
FEV6-%Pred-Post: 79 %
FEV6-%Pred-Pre: 72 %
FEV6-Post: 3.64 L
FEV6-Pre: 3.3 L
FEV6FVC-%Change-Post: 0 %
FEV6FVC-%Pred-Post: 101 %
FEV6FVC-%Pred-Pre: 100 %
FVC-%Change-Post: 9 %
FVC-%Pred-Post: 78 %
FVC-%Pred-Pre: 71 %
FVC-Post: 3.72 L
FVC-Pre: 3.4 L
Post FEV1/FVC ratio: 59 %
Post FEV6/FVC ratio: 98 %
Pre FEV1/FVC ratio: 55 %
Pre FEV6/FVC Ratio: 97 %
RV % pred: 182 %
RV: 3.52 L
TLC % pred: 113 %
TLC: 7.43 L

## 2022-01-19 MED ORDER — ALBUTEROL SULFATE (2.5 MG/3ML) 0.083% IN NEBU
2.5000 mg | INHALATION_SOLUTION | Freq: Once | RESPIRATORY_TRACT | Status: AC
  Administered 2022-01-19: 2.5 mg via RESPIRATORY_TRACT

## 2022-02-13 ENCOUNTER — Encounter: Payer: Self-pay | Admitting: Student in an Organized Health Care Education/Training Program

## 2022-02-13 ENCOUNTER — Ambulatory Visit (INDEPENDENT_AMBULATORY_CARE_PROVIDER_SITE_OTHER): Payer: BC Managed Care – PPO | Admitting: Student in an Organized Health Care Education/Training Program

## 2022-02-13 VITALS — BP 142/90 | HR 82 | Temp 96.9°F | Ht 68.0 in | Wt 163.6 lb

## 2022-02-13 DIAGNOSIS — J449 Chronic obstructive pulmonary disease, unspecified: Secondary | ICD-10-CM

## 2022-02-13 DIAGNOSIS — J438 Other emphysema: Secondary | ICD-10-CM

## 2022-02-13 MED ORDER — UMECLIDINIUM-VILANTEROL 62.5-25 MCG/ACT IN AEPB: 1.0000 | INHALATION_SPRAY | Freq: Every day | RESPIRATORY_TRACT | 11 refills | Status: DC

## 2022-02-13 MED ORDER — NICOTINE POLACRILEX 2 MG MT LOZG: 2.0000 mg | LOZENGE | OROMUCOSAL | 3 refills | Status: AC | PRN

## 2022-02-13 NOTE — Progress Notes (Signed)
Synopsis: Follow up for cough and shortness of breath.  Assessment & Plan:   #Chronic obstructive pulmonary disease #Shortness of Breath  Presents for the evaluation of shortness of breath and occasional cough. Suspect the cough is secondary to his continued nasal drainage from his sinusitis. He will continue to follow up with ENT. Review of his blood work is notable for an eosinophil count of 400. PFT's are consistent with COPD with a depressed FEV1. He also has significant reversibility following trial of bronchodilators on PFT's. Asthma/COPD overlap remains on the differential.  I have discussed COPD therapy with Kyle Clark and we will trial Anoro Ellipta one puff daily for a month and assess his response. Should he feel better with it, he will continue using it. Otherwise, he will revert to using Albuterol as needed.  We did discuss lung cancer screening during today's visit and the patient is amenable. I will place a referral to that effect. I will revisit alpha-1-antitrypsin testing in the future if the CT shows emphysema. Finally, I discussed flu vaccination today with Kyle Clark and he would like to defer - I will revisit this on follow up.  - umeclidinium-vilanterol (ANORO ELLIPTA) 62.5-25 MCG/ACT AEPB; Inhale 1 puff into the lungs daily.  Dispense: 30 each; Refill: 11 - Ambulatory Referral for Lung Cancer Scre - nicotine polacrilex (NICOTINE MINI) 2 MG lozenge; Take 1 lozenge (2 mg total) by mouth every 2 (two) hours as needed for smoking cessation.  Dispense: 72 lozenge; Refill: 3   Return in about 6 months (around 08/15/2022).  I spent 30 minutes caring for this patient today, including preparing to see the patient, obtaining a medical history , reviewing a separately obtained history, performing a medically appropriate examination and/or evaluation, counseling and educating the patient/family/caregiver, documenting clinical information in the electronic health record, and  independently interpreting results (not separately reported/billed) and communicating results to the patient/family/caregiver  Raechel Chute, MD [facility] 02/13/2022 4:37 PM    End of visit medications:  Meds ordered this encounter  Medications   umeclidinium-vilanterol (ANORO ELLIPTA) 62.5-25 MCG/ACT AEPB    Sig: Inhale 1 puff into the lungs daily.    Dispense:  30 each    Refill:  11   nicotine polacrilex (NICOTINE MINI) 2 MG lozenge    Sig: Take 1 lozenge (2 mg total) by mouth every 2 (two) hours as needed for smoking cessation.    Dispense:  72 lozenge    Refill:  3     Current Outpatient Medications:    albuterol (VENTOLIN HFA) 108 (90 Base) MCG/ACT inhaler, INHALE 1-2 PUFFS BY MOUTH EVERY 6 HOURS AS NEEDED FOR WHEEZE OR SHORTNESS OF BREATH, Disp: 18 each, Rfl: 3   Azelastine HCl 0.15 % SOLN, PLACE 1 SPRAY INTO THE NOSE IN THE MORNING AND AT BEDTIME., Disp: 30 mL, Rfl: 0   EPINEPHrine 0.3 mg/0.3 mL IJ SOAJ injection, Inject 0.3 mg into the muscle as needed., Disp: , Rfl:    fluticasone (FLONASE) 50 MCG/ACT nasal spray, Place 1 spray into both nostrils as needed., Disp: , Rfl:    levocetirizine (XYZAL) 5 MG tablet, TAKE 1 TABLET BY MOUTH EVERY DAY IN THE EVENING, Disp: 90 tablet, Rfl: 3   montelukast (SINGULAIR) 10 MG tablet, Take 1 tablet (10 mg total) by mouth at bedtime., Disp: 90 tablet, Rfl: 3   nicotine polacrilex (NICOTINE MINI) 2 MG lozenge, Take 1 lozenge (2 mg total) by mouth every 2 (two) hours as needed for smoking  cessation., Disp: 72 lozenge, Rfl: 3   pantoprazole (PROTONIX) 40 MG tablet, TAKE 1 TABLET (40 MG TOTAL) BY MOUTH TWICE A DAY BEFORE MEALS, Disp: , Rfl:    umeclidinium-vilanterol (ANORO ELLIPTA) 62.5-25 MCG/ACT AEPB, Inhale 1 puff into the lungs daily., Disp: 30 each, Rfl: 11   pantoprazole (PROTONIX) 40 MG tablet, Take 1 tablet (40 mg total) by mouth 2 (two) times daily. Take 30 minutes before breakfast and supper, Disp: 180 tablet,  Rfl: 1   Subjective:   PATIENT ID: Kyle Clark GENDER: male DOB: 1970/06/10, MRN: [id number]  Chief Complaint  Patient presents with   Follow-up    SOB with exertion or coughing really hard. Cough with clear sputum. Drainage.     HPI  Kyle Clark is a pleasant 51 year old clinic for the evaluation of shortness of breath.   Patient reports that his symptoms mostly consist of shortness of breath that is occasional, sudden in onset and not associated with other symptoms.  He denies any associated wheezing, cough, sputum production, hemoptysis, chest pain, chest tightness, fevers, or chills. He does feel that when the weather is humid/hot, he feels stuffy in his chest and it's more difficult for him to breath. He does not have a past medical history of asthma and denies any history of having recurrent lung infections/ailments. He has not had any recent upper respiratory tract infections.  He carries a diagnosis of emphysema 2 years ago that was noted on a chest x-ray.  He had been a long-term smoker (started smoking at the age of 27, quit 2 years ago) and that x-ray diagnosis prompted him to quit.  He was given albuterol by his primary care provider and he has not felt any improvement with it.   He does have a longstanding history of recurrent rhinitis and sinusitis for which he is now seeing ENT and allergy.  He is to be evaluated by his ENT provider next week whereby he will also have allergy testing.  He feels a continuous need to clear his throat/cough secondary to continued drainage from his sinuses.  He tells me that he was started on montelukast because of his allergies.   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 He does not have any pets. No report of mold at home. No occupational exposures noted.  Ancillary information including prior medications, full medical/surgical/family/social histories, and PFTs (when available) are listed below and have been reviewed.   Review of  Systems  Constitutional:  Negative for chills, diaphoresis, fever, malaise/fatigue and weight loss.  HENT:  Positive for congestion. Negative for nosebleeds and sore throat.   Eyes:  Negative for blurred vision.  Respiratory:  Positive for cough and shortness of breath. Negative for hemoptysis, sputum production, wheezing and stridor.   Cardiovascular:  Negative for chest pain, palpitations, orthopnea, leg swelling and PND.  Neurological:  Negative for dizziness and weakness.     Objective:   Vitals:   02/13/22 1454  BP: (!) 142/90  Pulse: 82  Temp: (!) 96.9 F (36.1 C)  SpO2: 98%  Weight: 163 lb 9.6 oz (74.2 kg)  Height: 5\' 8"  (1.727 m)   98% on RA  BMI Readings from Last 3 Encounters:  02/13/22 24.88 kg/m  11/30/21 24.81 kg/m  11/09/21 24.87 kg/m   Wt Readings from Last 3 Encounters:  02/13/22 163 lb 9.6 oz (74.2 kg)  11/30/21 163 lb 3.2 oz (74 kg)  11/09/21 166 lb (75.3 kg)    Physical Exam Constitutional:  Appearance: Normal appearance.  HENT:     Head: Normocephalic.     Mouth/Throat:     Mouth: Mucous membranes are moist.  Eyes:     Pupils: Pupils are equal, round, and reactive to light.  Cardiovascular:     Rate and Rhythm: Normal rate and regular rhythm.     Pulses: Normal pulses.     Heart sounds: Normal heart sounds.  Pulmonary:     Effort: Pulmonary effort is normal.     Breath sounds: Normal breath sounds.  Abdominal:     General: Abdomen is flat.     Palpations: Abdomen is soft.  Musculoskeletal:        General: Normal range of motion.     Cervical back: Normal range of motion.  Skin:    General: Skin is warm.  Neurological:     General: No focal deficit present.     Mental Status: He is alert and oriented to person, place, and time.       Ancillary Information    Past Medical History:  Diagnosis Date   Allergic rhinitis    Hyperlipidemia    Hypertension      Family History  Problem Relation Age of Onset   Healthy  Mother    Congestive Heart Failure Father    Healthy Sister    Healthy Daughter      Past Surgical History:  Procedure Laterality Date   ESOPHAGOGASTRODUODENOSCOPY N/A 11/17/2020   Procedure: ESOPHAGOGASTRODUODENOSCOPY (EGD) with possible intervention;  Surgeon: Jaynie Collins, DO;  Location: [facility] ENDOSCOPY;  Service: Gastroenterology;  Laterality: N/A;  food impaction   NO PAST SURGERIES      Social History   Socioeconomic History   Marital status: Married    Spouse name: Not on file   Number of children: Not on file   Years of education: Not on file   Highest education level: Not on file  Occupational History   Not on file  Tobacco Use   Smoking status: Former    Packs/day: 1.00    Years: 25.00    Total pack years: 25.00    Types: Cigarettes    Quit date: 03/14/2019    Years since quitting: 2.9   Smokeless tobacco: Never  Vaping Use   Vaping Use: Every day  Substance and Sexual Activity   Alcohol use: Yes    Comment: occasional alcohol use, drinks beer on weekends maybe once a week   Drug use: No   Sexual activity: Not on file  Other Topics Concern   Not on file  Social History Narrative   Not on file   Social Determinants of Health   Financial Resource Strain: Not on file  Food Insecurity: Not on file  Transportation Needs: Not on file  Physical Activity: Not on file  Stress: Not on file  Social Connections: Not on file  Intimate Partner Violence: Not on file     Allergies  Allergen Reactions   Lisinopril Palpitations    Other reaction(s): Rapid pulse High blood pressure      CBC    Component Value Date/Time   WBC 7.7 11/16/2020 2130   RBC 5.10 11/16/2020 2130   HGB 15.9 11/16/2020 2130   HGB 17.8 (H) 05/15/2017 0806   HCT 43.5 11/16/2020 2130   HCT 50.2 05/15/2017 0806   PLT 279 11/16/2020 2130   PLT 248 05/15/2017 0806   MCV 85.3 11/16/2020 2130   MCV 87 05/15/2017 0806   MCH 31.2 11/16/2020 2130  MCHC 36.6 (H) 11/16/2020 2130    RDW 11.2 (L) 11/16/2020 2130   RDW 12.7 05/15/2017 0806   LYMPHSABS 3.2 11/16/2020 2130   LYMPHSABS 3.9 (H) 05/15/2017 0806   MONOABS 0.5 11/16/2020 2130   EOSABS 0.4 11/16/2020 2130   EOSABS 0.2 05/15/2017 0806   BASOSABS 0.1 11/16/2020 2130   BASOSABS 0.0 05/15/2017 0806    Pulmonary Functions Testing Results:    Latest Ref Rng & Units 01/19/2022    2:32 PM  PFT Results  FVC-Pre L 3.40   FVC-Predicted Pre % 71   FVC-Post L 3.72   FVC-Predicted Post % 78   Pre FEV1/FVC % % 55   Post FEV1/FCV % % 59   FEV1-Pre L 1.87   FEV1-Predicted Pre % 50   FEV1-Post L 2.18   DLCO uncorrected ml/min/mmHg 16.70   DLCO UNC% % 60   DLVA Predicted % 63   TLC L 7.43   TLC % Predicted % 113   RV % Predicted % 182     Outpatient Medications Prior to Visit  Medication Sig Dispense Refill   albuterol (VENTOLIN HFA) 108 (90 Base) MCG/ACT inhaler INHALE 1-2 PUFFS BY MOUTH EVERY 6 HOURS AS NEEDED FOR WHEEZE OR SHORTNESS OF BREATH 18 each 3   Azelastine HCl 0.15 % SOLN PLACE 1 SPRAY INTO THE NOSE IN THE MORNING AND AT BEDTIME. 30 mL 0   EPINEPHrine 0.3 mg/0.3 mL IJ SOAJ injection Inject 0.3 mg into the muscle as needed.     fluticasone (FLONASE) 50 MCG/ACT nasal spray Place 1 spray into both nostrils as needed.     levocetirizine (XYZAL) 5 MG tablet TAKE 1 TABLET BY MOUTH EVERY DAY IN THE EVENING 90 tablet 3   montelukast (SINGULAIR) 10 MG tablet Take 1 tablet (10 mg total) by mouth at bedtime. 90 tablet 3   pantoprazole (PROTONIX) 40 MG tablet TAKE 1 TABLET (40 MG TOTAL) BY MOUTH TWICE A DAY BEFORE MEALS     pantoprazole (PROTONIX) 40 MG tablet Take 1 tablet (40 mg total) by mouth 2 (two) times daily. Take 30 minutes before breakfast and supper 180 tablet 1   No facility-administered medications prior to visit.

## 2022-04-28 ENCOUNTER — Encounter: Payer: Self-pay | Admitting: *Deleted

## 2022-05-30 ENCOUNTER — Other Ambulatory Visit: Payer: Self-pay | Admitting: Physician Assistant

## 2022-05-30 DIAGNOSIS — R062 Wheezing: Secondary | ICD-10-CM

## 2022-07-13 ENCOUNTER — Other Ambulatory Visit: Payer: Self-pay | Admitting: Physician Assistant

## 2022-07-14 ENCOUNTER — Other Ambulatory Visit: Payer: Self-pay

## 2022-07-14 MED ORDER — EPINEPHRINE 0.3 MG/0.3ML IJ SOAJ
0.3000 mg | INTRAMUSCULAR | 0 refills | Status: AC | PRN
  Filled 2022-07-14: qty 2, 30d supply, fill #0

## 2022-07-14 MED ORDER — PANTOPRAZOLE SODIUM 40 MG PO TBEC
40.0000 mg | DELAYED_RELEASE_TABLET | Freq: Two times a day (BID) | ORAL | 0 refills | Status: AC
  Filled 2022-07-14: qty 60, 30d supply, fill #0

## 2022-07-14 MED ORDER — FLUTICASONE PROPIONATE 50 MCG/ACT NA SUSP
1.0000 | NASAL | 0 refills | Status: AC | PRN
  Filled 2022-07-14: qty 16, 60d supply, fill #0

## 2022-07-17 ENCOUNTER — Other Ambulatory Visit: Payer: Self-pay

## 2022-07-28 ENCOUNTER — Other Ambulatory Visit: Payer: Self-pay

## 2022-08-01 IMAGING — CR DG CHEST 2V
1 series · 2 of 2 positions shown · non-contrast
Comparison: 05/08/2007

CLINICAL DATA: History of cough and congestion for 1 year, history
of tobacco use

EXAM:
CHEST - 2 VIEW

[Series 1: dg chest 2 view · 0.14mm/px · 2 of 2 slices shown]
[im 1/2]
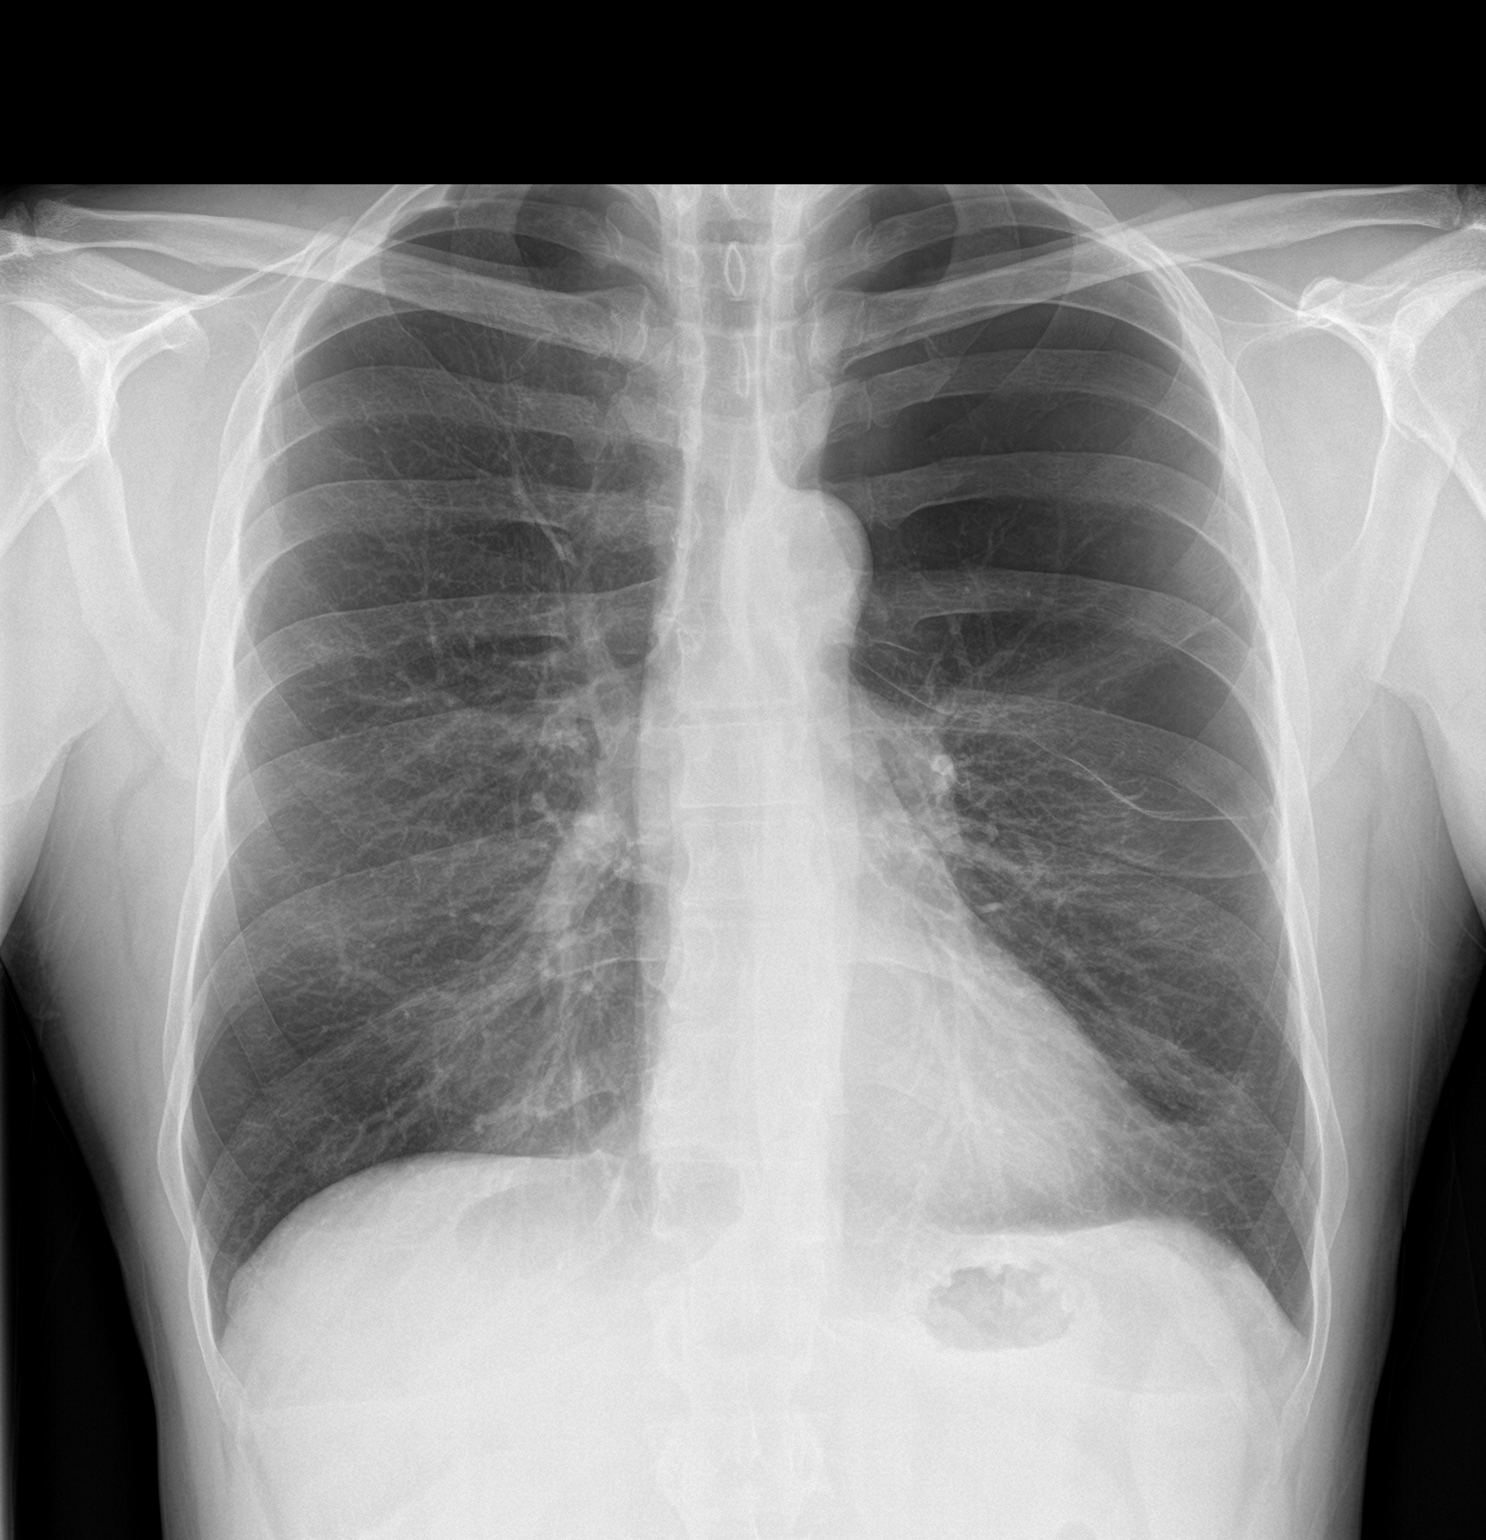
[im 2/2]
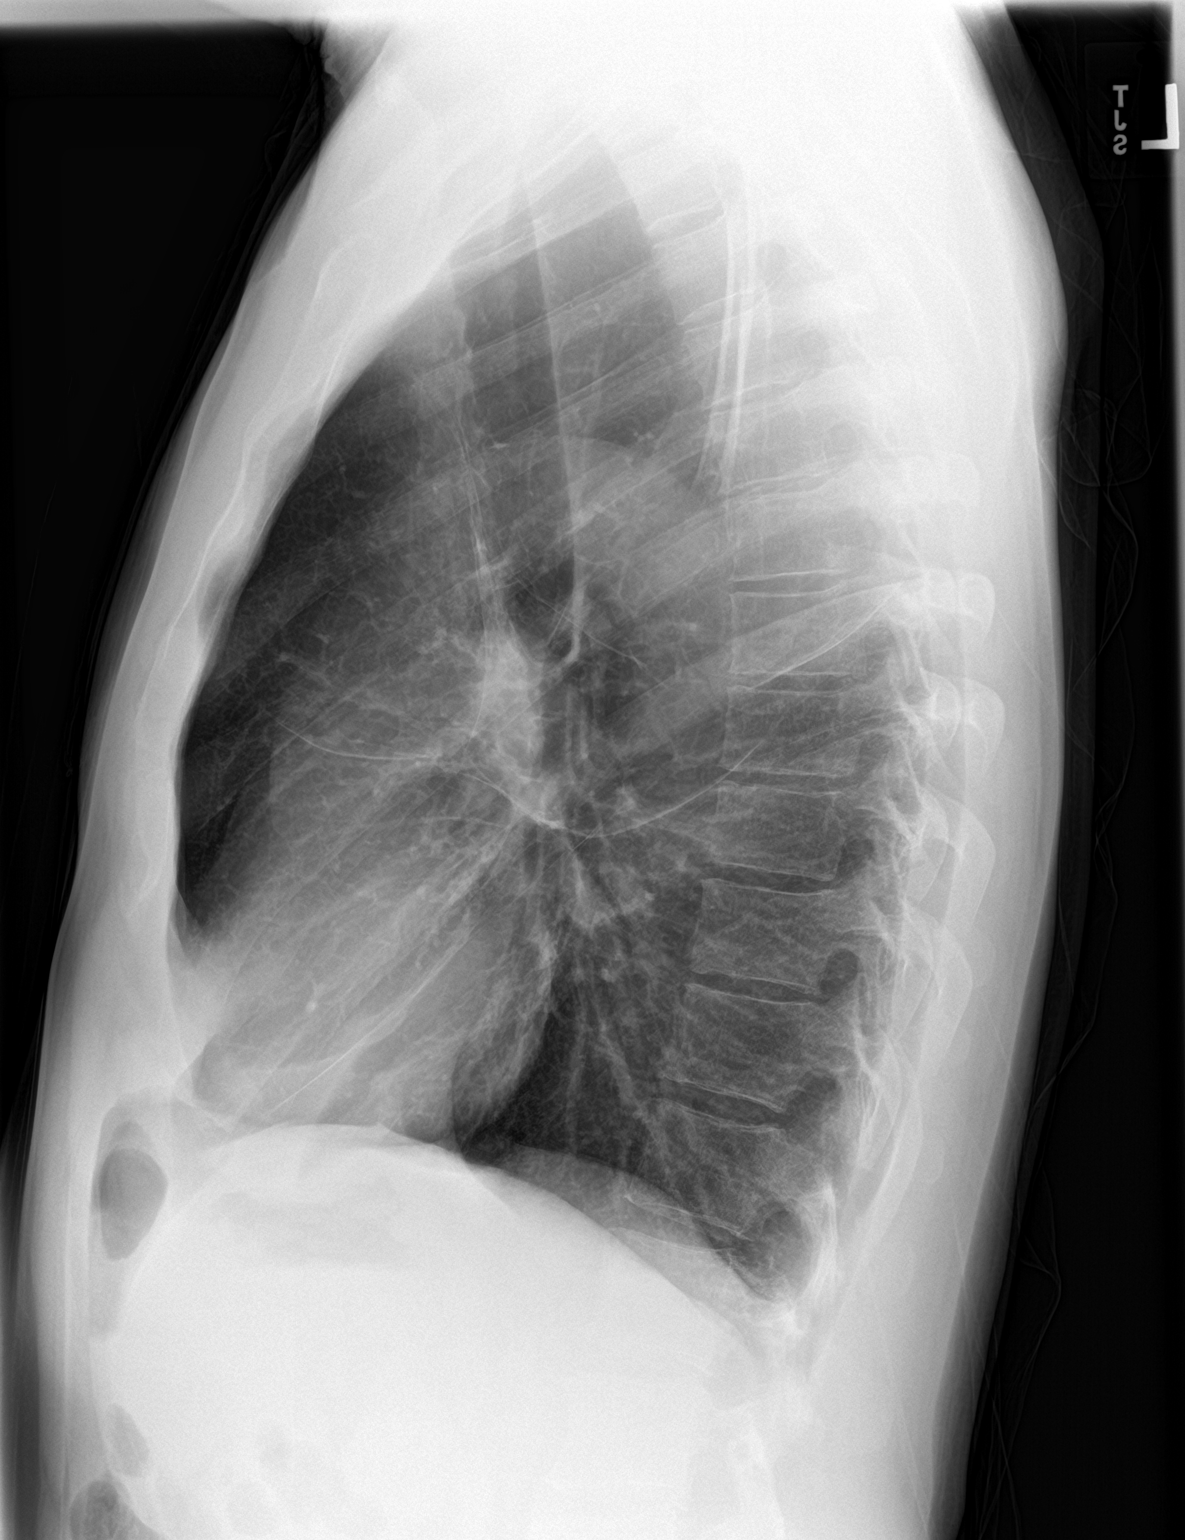

[2 of 2 positions shown; findings below may reference images not displayed]

FINDINGS: Cardiac shadow is stable. The lungs are again hyperinflated
consistent with COPD. Considerable emphysematous changes noted in
the left upper lobe increased when compared with the prior exam. No
new focal infiltrate or soft tissue density is seen. No bony
abnormality is noted.
IMPRESSION: Extensive emphysematous changes worst in the left upper lobe
increased from the prior exam.

## 2022-08-01 IMAGING — CT CT MAXILLOFACIAL W/O CM
3 of 5 series · 14 of 47 positions shown, 16 images · non-contrast
Comparison: None.

CLINICAL DATA: Maxillofacial pain.

EXAM:
CT MAXILLOFACIAL WITHOUT CONTRAST
TECHNIQUE: Multidetector CT images of the paranasal sinuses were obtained using
the standard protocol without intravenous contrast.

[Series 6: sinus 2.00 cor · coronal · 0.25mm/px · 3 of 69 slices shown]
[im 23/69  bone]
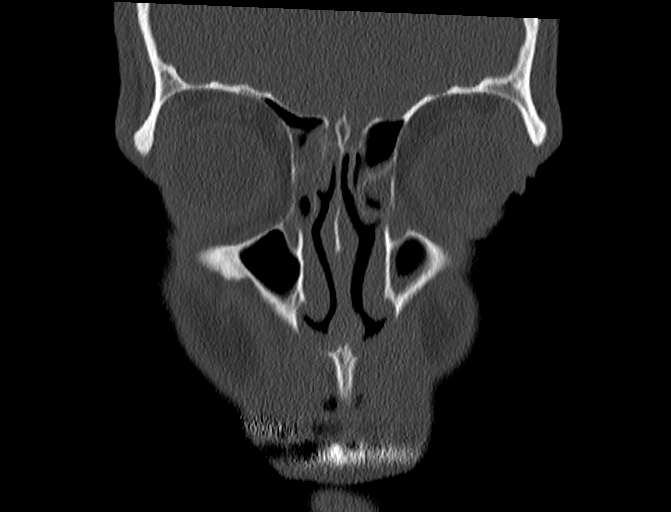
[im 31/69  bone]
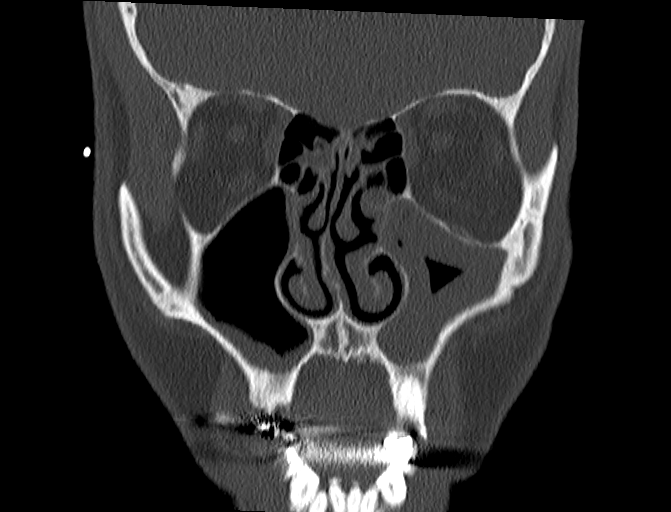
[im 38/69  bone]
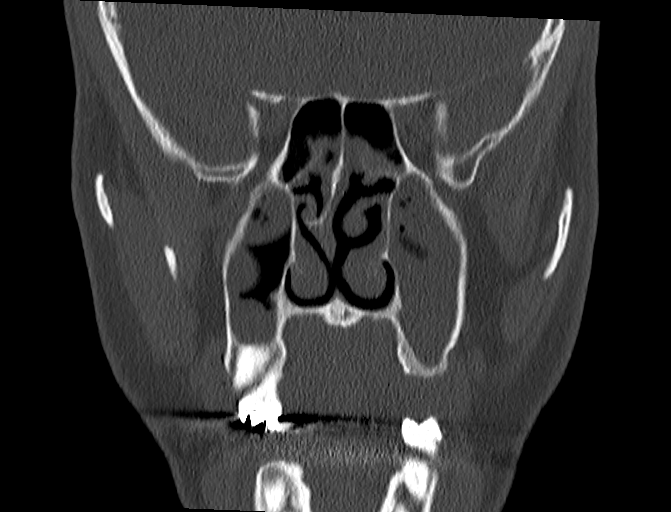

[Series 8: sinus 2.00 sag · sagittal · 0.25mm/px · 3 of 82 slices shown]
[im 28/82  bone]
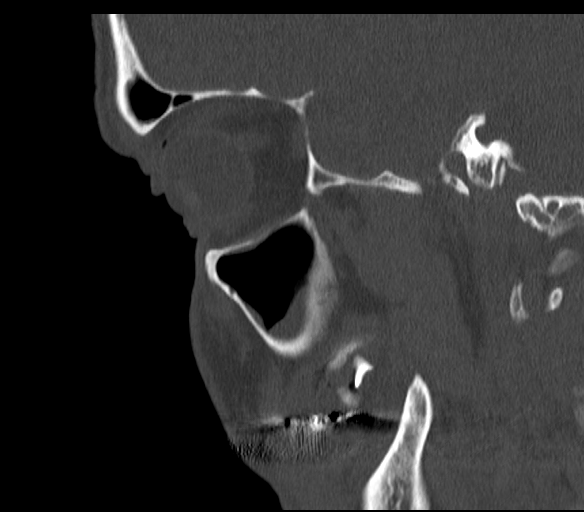
[im 41/82  bone]
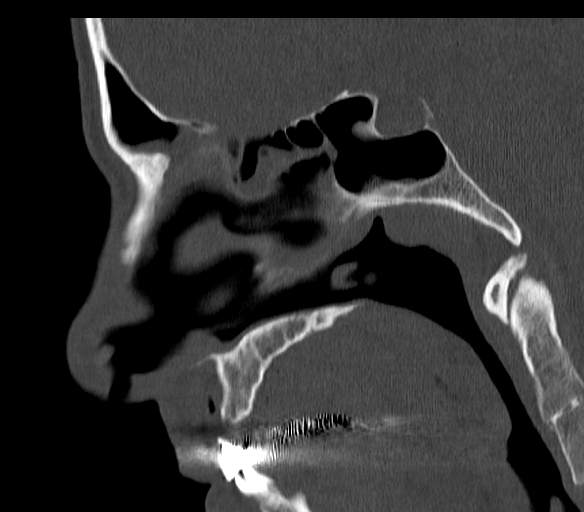
[im 55/82  bone]
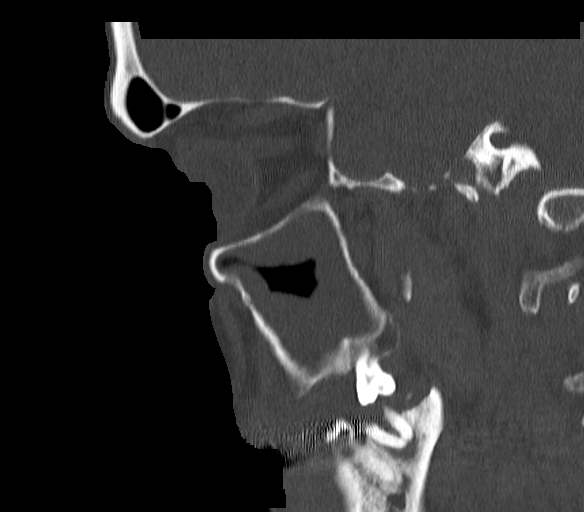

[Series 10: thins sinus 1.00 ax · axial · 0.26mm/px · z∈[-611,-506]mm · 8 of 123 slices shown, 10 images]
[im 9/123  brain]
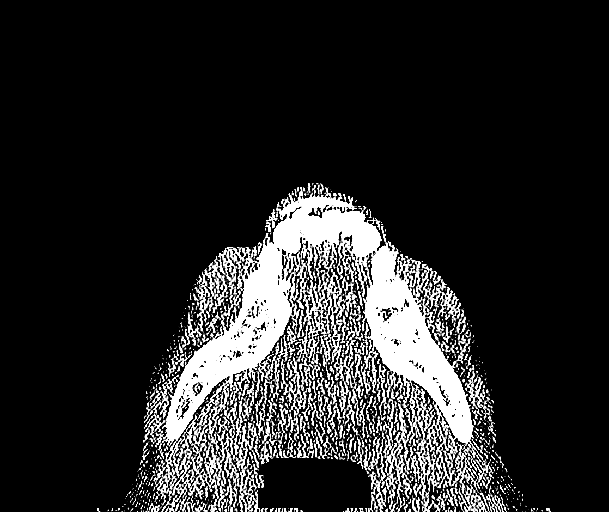
[im 9/123  bone]
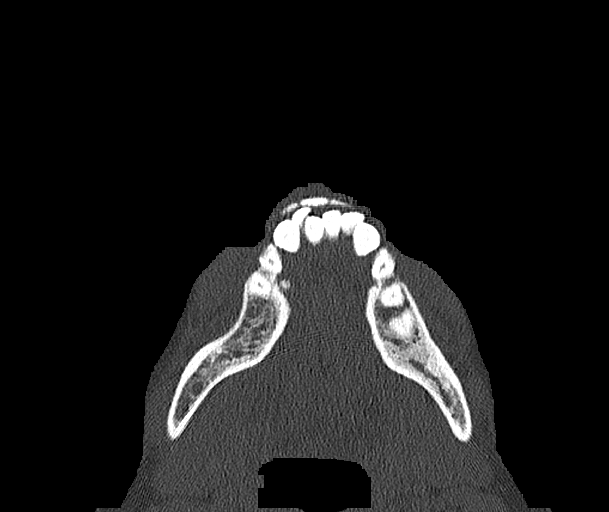
[im 27/123  bone]
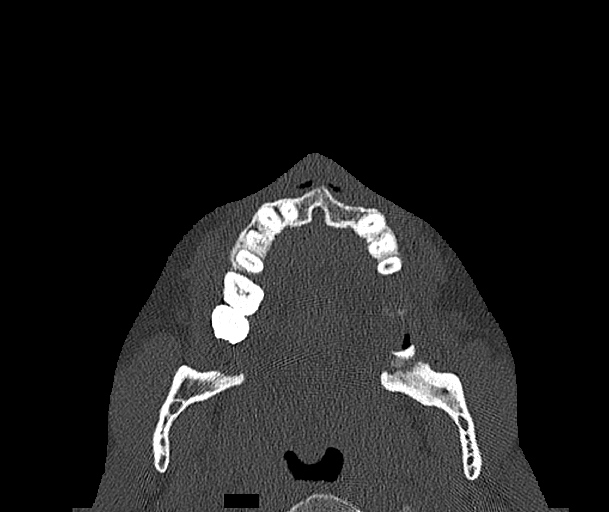
[im 44/123  bone]
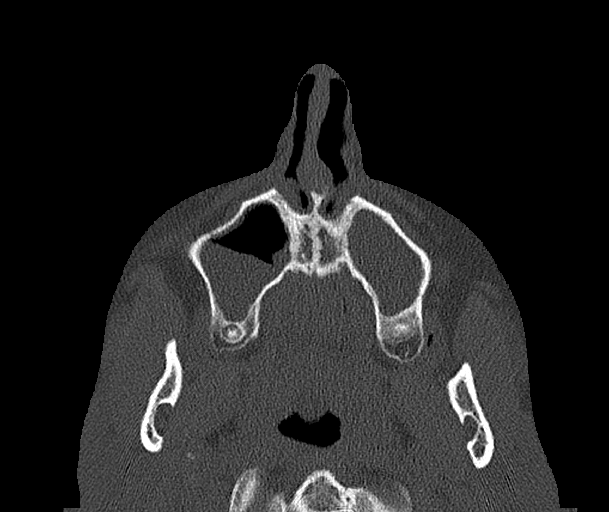
[im 53/123  bone]
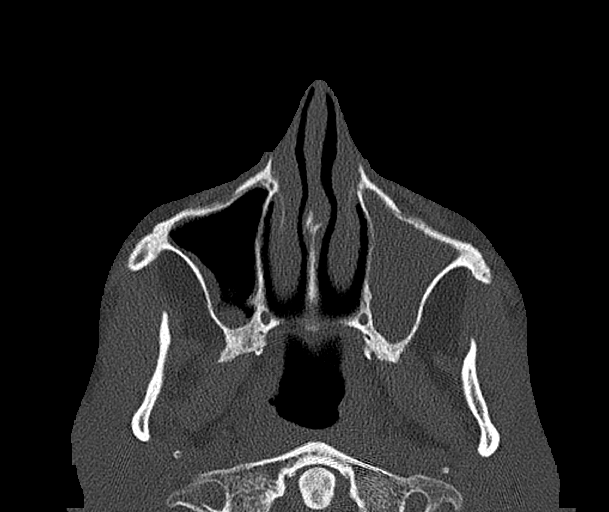
[im 70/123  brain]
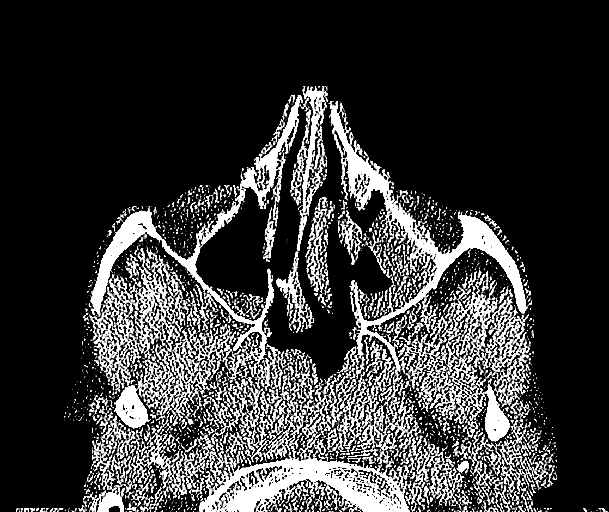
[im 70/123  bone]
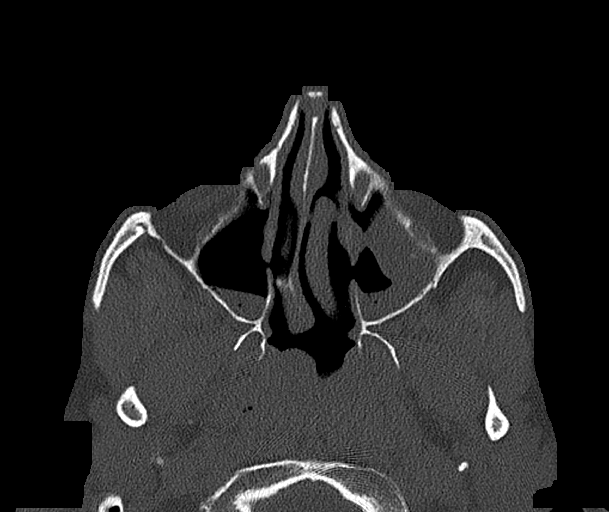
[im 79/123  bone]
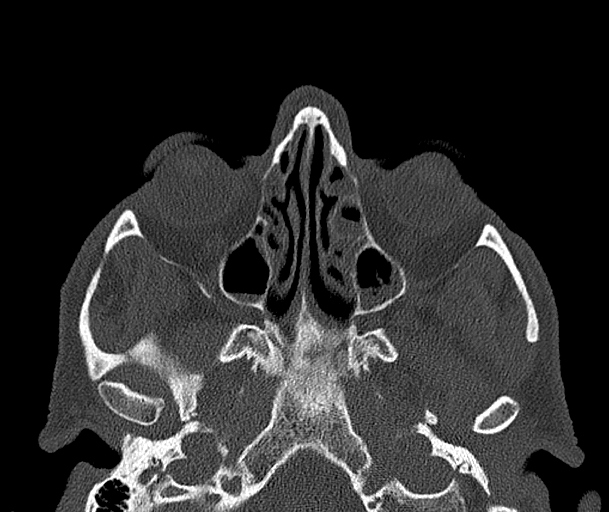
[im 96/123  bone]
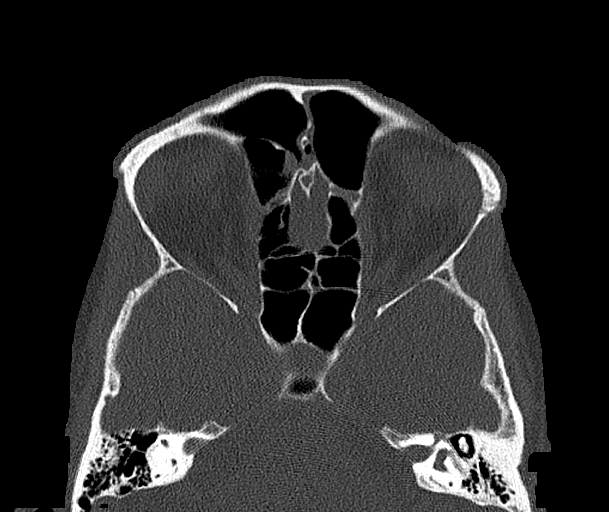
[im 114/123  bone]
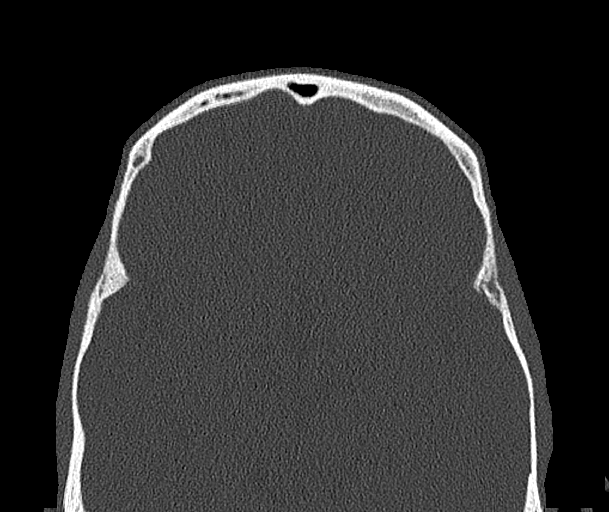

[14 of 47 positions shown; findings below may reference images not displayed]

FINDINGS: Paranasal sinuses:

Frontal: Mild mucosal edema in the frontal sinus bilaterally.

Ethmoid: Moderate mucosal edema bilaterally.

Maxillary: Moderate mucosal edema bilaterally, left greater than
right. Associated air-fluid level on the left.

Sphenoid: Mild mucosal edema.

Mastoid and middle ear: Clear bilaterally.

Right ostiomeatal unit: Patent but narrowed due to mucosal edema.

Left ostiomeatal unit: Patent but slightly narrowed due to mucosal
edema.

Nasal passages: Patent. Nasal septum deviated to the right with
right lateral spurring.

Anatomy: Keros type 2 olfactory recess

No acute skeletal abnormality.

Limited intracranial imaging negative

Negative orbit bilaterally.
IMPRESSION: Mucosal edema throughout the paranasal sinuses most prominent left
maxillary sinus. Associated air-fluid level left maxillary sinus.
Sinus drainage pathways clear.

## 2022-11-23 ENCOUNTER — Other Ambulatory Visit: Payer: Self-pay | Admitting: Physician Assistant

## 2022-11-29 ENCOUNTER — Encounter: Payer: Self-pay | Admitting: Student in an Organized Health Care Education/Training Program

## 2022-11-29 ENCOUNTER — Ambulatory Visit: Payer: BC Managed Care – PPO | Admitting: Student in an Organized Health Care Education/Training Program

## 2022-11-29 VITALS — BP 124/84 | HR 82 | Temp 97.8°F | Ht 68.0 in | Wt 163.2 lb

## 2022-11-29 DIAGNOSIS — R0602 Shortness of breath: Secondary | ICD-10-CM | POA: Diagnosis not present

## 2022-11-29 DIAGNOSIS — J449 Chronic obstructive pulmonary disease, unspecified: Secondary | ICD-10-CM | POA: Diagnosis not present

## 2022-11-29 DIAGNOSIS — J4540 Moderate persistent asthma, uncomplicated: Secondary | ICD-10-CM

## 2022-11-29 LAB — NITRIC OXIDE: Nitric Oxide: 81

## 2022-11-29 MED ORDER — TRELEGY ELLIPTA 200-62.5-25 MCG/ACT IN AEPB: 1.0000 | INHALATION_SPRAY | Freq: Every day | RESPIRATORY_TRACT | 12 refills | Status: DC

## 2022-11-29 NOTE — Progress Notes (Signed)
Assessment & Plan:   #Chronic obstructive pulmonary disease, unspecified COPD type (HCC) #Moderate persistent asthma without complication  Presents for follow up on shortness of breath, cough, and wheeze. Patient underwent PFT's that showed obstruction with a reversible component to spirometry. FENO today in clinic was notably elevated at 81 ppb. 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 Will switch the patient to Trelegy to be used once daily. Cessation of vape use encouraged.  - Fluticasone-Umeclidin-Vilant (TRELEGY ELLIPTA) 200-62.5-25 MCG/ACT AEPB; Inhale 1 puff into the lungs daily.  Dispense: 30 each; Refill: 12 - FENO elevated at 81 ppb - smoking cessation recommended    Return in about 3 months (around 02/28/2023).  I spent 30 minutes caring for this patient today, including preparing to see the patient, obtaining a medical history , reviewing a separately obtained history, performing a medically appropriate examination and/or evaluation, counseling and educating the patient/family/caregiver, ordering medications, tests, or procedures, and documenting clinical information in the electronic health record  Kyle Chute, MD [facility] 11/29/2022 8:59 AM    End of visit medications:  Meds ordered this encounter  Medications   Fluticasone-Umeclidin-Vilant (TRELEGY ELLIPTA) 200-62.5-25 MCG/ACT AEPB    Sig: Inhale 1 puff into the lungs daily.    Dispense:  30 each    Refill:  12     Current Outpatient Medications:    albuterol (VENTOLIN HFA) 108 (90 Base) MCG/ACT inhaler, INHALE 1-2 PUFFS BY MOUTH EVERY 6 HOURS AS NEEDED FOR WHEEZE OR SHORTNESS OF BREATH, Disp: 18 each, Rfl: 3   Azelastine HCl 0.15 % SOLN, PLACE 1 SPRAY INTO THE NOSE IN THE MORNING AND AT BEDTIME., Disp: 30 mL, Rfl: 0   EPINEPHrine 0.3 mg/0.3 mL IJ SOAJ injection, Inject 0.3 mg into the muscle as needed., Disp: 2  each, Rfl: 0   fluticasone (FLONASE) 50 MCG/ACT nasal spray, Place 1 spray into both nostrils as needed., Disp: 16 g, Rfl: 0   Fluticasone-Umeclidin-Vilant (TRELEGY ELLIPTA) 200-62.5-25 MCG/ACT AEPB, Inhale 1 puff into the lungs daily., Disp: 30 each, Rfl: 12   levocetirizine (XYZAL) 5 MG tablet, TAKE 1 TABLET BY MOUTH EVERY DAY IN THE EVENING, Disp: 90 tablet, Rfl: 3   montelukast (SINGULAIR) 10 MG tablet, TAKE 1 TABLET BY MOUTH EVERYDAY AT BEDTIME, Disp: 90 tablet, Rfl: 3   pantoprazole (PROTONIX) 40 MG tablet, Take 1 tablet (40 mg total) by mouth 2 (two) times daily., Disp: 60 tablet, Rfl: 0   pantoprazole (PROTONIX) 40 MG tablet, Take 1 tablet (40 mg total) by mouth 2 (two) times daily. Take 30 minutes before breakfast and supper, Disp: 180 tablet, Rfl: 1   Subjective:   PATIENT ID: Kyle Clark GENDER: male DOB: 1971-03-06, MRN: [id number]  Chief Complaint  Patient presents with   Follow-up    Breathing is the same. Drainage. Cough with clear sputum. Little wheezing. DOE.    HPI  Kyle Clark is a pleasant 52 year old clinic for the evaluation of shortness of breath.  Since our last visit, his symptoms have persisted. He's continued to experience exertional dyspnea, cough, and some wheezing. This is at times relieved by the use of an inhaler. Hot/humid weather continues to exacerbate his symptoms. He has not had any exacerbation or need for steroids since our last visit. He did undergo pulmonary function testing 01/19/2022 that showed obstructive lung disease with reversibility.   He carries a diagnosis of emphysema 2 years ago that  was noted on a chest x-ray.  He had been a long-term smoker (started smoking at the age of 60, quit 3 years ago) and that x-ray diagnosis prompted him to quit.   He does have a longstanding history of recurrent rhinitis and sinusitis for which he is now seeing ENT and allergy. He's followed with ENT as well as an allergist without much improvement in his  symptoms. He feels a continuous need to clear his throat/cough secondary to continued drainage from his sinuses.    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 They now have a new pet dog that's still a puppy. He does report the use of nicotine vapes. No report of mold at home. No occupational exposures noted.  Ancillary information including prior medications, full medical/surgical/family/social histories, and PFTs (when available) are listed below and have been reviewed.   Review of Systems  Constitutional:  Negative for chills, diaphoresis, fever, malaise/fatigue and weight loss.  HENT:  Positive for congestion. Negative for nosebleeds and sore throat.   Eyes:  Negative for blurred vision.  Respiratory:  Positive for cough, shortness of breath and wheezing. Negative for hemoptysis, sputum production and stridor.   Cardiovascular:  Negative for chest pain, palpitations, orthopnea, leg swelling and PND.  Neurological:  Negative for dizziness and weakness.     Objective:   Vitals:   11/29/22 0837  BP: 124/84  Pulse: 82  Temp: 97.8 F (36.6 C)  SpO2: 96%  Weight: 163 lb 3.2 oz (74 kg)  Height: 5\' 8"  (1.727 m)   96% on RA  BMI Readings from Last 3 Encounters:  11/29/22 24.81 kg/m  02/13/22 24.88 kg/m  11/30/21 24.81 kg/m   Wt Readings from Last 3 Encounters:  11/29/22 163 lb 3.2 oz (74 kg)  02/13/22 163 lb 9.6 oz (74.2 kg)  11/30/21 163 lb 3.2 oz (74 kg)    Physical Exam Constitutional:      Appearance: Normal appearance.  HENT:     Head: Normocephalic.     Mouth/Throat:     Mouth: Mucous membranes are moist.  Eyes:     Pupils: Pupils are equal, round, and reactive to light.  Cardiovascular:     Rate and Rhythm: Normal rate and regular rhythm.     Pulses: Normal pulses.     Heart sounds: Normal heart sounds.  Pulmonary:     Effort: Pulmonary effort is normal.     Breath sounds: Normal breath sounds.  Abdominal:     General: Abdomen is flat.      Palpations: Abdomen is soft.  Musculoskeletal:        General: Normal range of motion.     Cervical back: Normal range of motion.  Skin:    General: Skin is warm.  Neurological:     General: No focal deficit present.     Mental Status: He is alert and oriented to person, place, and time.       Ancillary Information    Past Medical History:  Diagnosis Date   Allergic rhinitis    Hyperlipidemia    Hypertension      Family History  Problem Relation Age of Onset   Healthy Mother    Congestive Heart Failure Father    Healthy Sister    Healthy Daughter      Past Surgical History:  Procedure Laterality Date   ESOPHAGOGASTRODUODENOSCOPY N/A 11/17/2020   Procedure: ESOPHAGOGASTRODUODENOSCOPY (EGD) with possible intervention;  Surgeon: Jaynie Collins, DO;  Location[facility] ENDOSCOPY;  Service:  Gastroenterology;  Laterality: N/A;  food impaction   NO PAST SURGERIES      Social History   Socioeconomic History   Marital status: Married    Spouse name: Not on file   Number of children: Not on file   Years of education: Not on file   Highest education level: Not on file  Occupational History   Not on file  Tobacco Use   Smoking status: Former    Current packs/day: 0.00    Average packs/day: 1 pack/day for 25.0 years (25.0 ttl pk-yrs)    Types: Cigarettes    Start date: 03/13/1994    Quit date: 03/14/2019    Years since quitting: 3.7   Smokeless tobacco: Never  Vaping Use   Vaping status: Every Day  Substance and Sexual Activity   Alcohol use: Yes    Comment: occasional alcohol use, drinks beer on weekends maybe once a week   Drug use: No   Sexual activity: Not on file  Other Topics Concern   Not on file  Social History Narrative   Not on file   Social Determinants of Health   Financial Resource Strain: Not on file  Food Insecurity: Not on file  Transportation Needs: Not on file  Physical Activity: Not on file  Stress: Not on file  Social Connections: Not  on file  Intimate Partner Violence: Not on file     Allergies  Allergen Reactions   Lisinopril Palpitations    Other reaction(s): Rapid pulse High blood pressure      CBC    Component Value Date/Time   WBC 7.7 11/16/2020 2130   RBC 5.10 11/16/2020 2130   HGB 15.9 11/16/2020 2130   HGB 17.8 (H) 05/15/2017 0806   HCT 43.5 11/16/2020 2130   HCT 50.2 05/15/2017 0806   PLT 279 11/16/2020 2130   PLT 248 05/15/2017 0806   MCV 85.3 11/16/2020 2130   MCV 87 05/15/2017 0806   MCH 31.2 11/16/2020 2130   MCHC 36.6 (H) 11/16/2020 2130   RDW 11.2 (L) 11/16/2020 2130   RDW 12.7 05/15/2017 0806   LYMPHSABS 3.2 11/16/2020 2130   LYMPHSABS 3.9 (H) 05/15/2017 0806   MONOABS 0.5 11/16/2020 2130   EOSABS 0.4 11/16/2020 2130   EOSABS 0.2 05/15/2017 0806   BASOSABS 0.1 11/16/2020 2130   BASOSABS 0.0 05/15/2017 0806    Pulmonary Functions Testing Results:    Latest Ref Rng & Units 01/19/2022    2:32 PM  PFT Results  FVC-Pre L 3.40   FVC-Predicted Pre % 71   FVC-Post L 3.72   FVC-Predicted Post % 78   Pre FEV1/FVC % % 55   Post FEV1/FCV % % 59   FEV1-Pre L 1.87   FEV1-Predicted Pre % 50   FEV1-Post L 2.18   DLCO uncorrected ml/min/mmHg 16.70   DLCO UNC% % 60   DLVA Predicted % 63   TLC L 7.43   TLC % Predicted % 113   RV % Predicted % 182     Outpatient Medications Prior to Visit  Medication Sig Dispense Refill   albuterol (VENTOLIN HFA) 108 (90 Base) MCG/ACT inhaler INHALE 1-2 PUFFS BY MOUTH EVERY 6 HOURS AS NEEDED FOR WHEEZE OR SHORTNESS OF BREATH 18 each 3   Azelastine HCl 0.15 % SOLN PLACE 1 SPRAY INTO THE NOSE IN THE MORNING AND AT BEDTIME. 30 mL 0   EPINEPHrine 0.3 mg/0.3 mL IJ SOAJ injection Inject 0.3 mg into the muscle as needed. 2 each 0  fluticasone (FLONASE) 50 MCG/ACT nasal spray Place 1 spray into both nostrils as needed. 16 g 0   levocetirizine (XYZAL) 5 MG tablet TAKE 1 TABLET BY MOUTH EVERY DAY IN THE EVENING 90 tablet 3   montelukast (SINGULAIR) 10 MG tablet  TAKE 1 TABLET BY MOUTH EVERYDAY AT BEDTIME 90 tablet 3   pantoprazole (PROTONIX) 40 MG tablet Take 1 tablet (40 mg total) by mouth 2 (two) times daily. 60 tablet 0   umeclidinium-vilanterol (ANORO ELLIPTA) 62.5-25 MCG/ACT AEPB Inhale 1 puff into the lungs daily. 30 each 11   pantoprazole (PROTONIX) 40 MG tablet Take 1 tablet (40 mg total) by mouth 2 (two) times daily. Take 30 minutes before breakfast and supper 180 tablet 1   No facility-administered medications prior to visit.

## 2022-12-05 ENCOUNTER — Other Ambulatory Visit: Payer: Self-pay | Admitting: Physician Assistant

## 2022-12-05 DIAGNOSIS — R062 Wheezing: Secondary | ICD-10-CM

## 2022-12-11 ENCOUNTER — Other Ambulatory Visit: Payer: Self-pay

## 2022-12-11 DIAGNOSIS — Z122 Encounter for screening for malignant neoplasm of respiratory organs: Secondary | ICD-10-CM

## 2022-12-11 DIAGNOSIS — Z87891 Personal history of nicotine dependence: Secondary | ICD-10-CM

## 2022-12-22 ENCOUNTER — Ambulatory Visit: Payer: BC Managed Care – PPO | Admitting: Physician Assistant

## 2022-12-22 ENCOUNTER — Encounter: Payer: Self-pay | Admitting: Physician Assistant

## 2022-12-22 DIAGNOSIS — Z87891 Personal history of nicotine dependence: Secondary | ICD-10-CM | POA: Diagnosis not present

## 2022-12-22 DIAGNOSIS — Z122 Encounter for screening for malignant neoplasm of respiratory organs: Secondary | ICD-10-CM

## 2022-12-22 NOTE — Progress Notes (Signed)
Virtual Visit via Telephone Note  I connected with Kyle Clark on 12/22/22 at 1130 by telephone and verified that I am speaking with the correct person using two identifiers.  Location: Patient: home Provider: working virtually from home   I discussed the limitations, risks, security and privacy concerns of performing an evaluation and management service by telephone and the availability of in person appointments. I also discussed with the patient that there may be a patient responsible charge related to this service. The patient expressed understanding and agreed to proceed.       Shared Decision Making Visit Lung Cancer Screening Program [phone])   Eligibility: Age 4 Pack Years Smoking History Calculation 62 (# packs/per year x # years smoked) Recent History of coughing up blood  no Unexplained weight loss? No ( >Than 15 pounds within the last 6 months ) Prior History Lung / other cancer No (Diagnosis within the last 5 years already requiring surveillance chest CT Scans). Smoking Status Former Smoker Former Smokers: Years since quit: 3 years  Quit Date: 2021  Visit Components: Discussion included one or more decision making aids? Yes Discussion included risk/benefits of screening. Yes Discussion included potential follow up diagnostic testing for abnormal scans. Yes Discussion included meaning and risk of over diagnosis. Yes Discussion included meaning and risk of False Positives. Yes Discussion included meaning of total radiation exposure. Yes  Counseling Included: Importance of adherence to annual lung cancer LDCT screening. Yes Impact of comorbidities on ability to participate in the program. Yes Ability and willingness to under diagnostic treatment. Yes  Smoking Cessation Counseling: Former Smokers:  Discussed the importance of maintaining cigarette abstinence. Yes Diagnosis Code: Personal History of Nicotine Dependence. X91.478 Information about tobacco  cessation classes and interventions provided to patient. Yes Written Order for Lung Cancer Screening with LDCT placed in Epic. Yes (CT Chest Lung Cancer Screening Low Dose W/O CM) [id number] Z12.2-Screening of respiratory organs Z87.891-Personal history of nicotine dependence    I spent 25 minutes of face to face time/virtual visit time  with the patient discussing the risks and benefits of lung cancer screening. We took the time to pause at intervals to allow for questions to be asked and answered to ensure understanding. We discussed that they had taken the single most powerful action possible to decrease their risk of developing lung cancer when they quit smoking. I counseled them to remain smoke free, and to contact the office if they ever had the desire to smoke again so that we can provide resources and tools to help support the effort to remain smoke free. We discussed the time and location of the scan, and they  will receive a call or letter with the results within  24-72 hours of receiving them. They have the office contact information in the event they have questions.   They verbalized understanding of all of the above and had no further questions.    I explained to the patient that there has been a high incidence of coronary artery disease noted on these exams. I explained that this is a non-gated exam therefore degree or severity cannot be determined. This patient is not on statin therapy. I have asked the patient to follow-up with their PCP regarding any incidental finding of coronary artery disease and management with diet or medication as they feel is clinically indicated. The patient verbalized understanding of the above and had no further questions.      Darcella Gasman Carole Deere, PA-C

## 2022-12-22 NOTE — Patient Instructions (Signed)

## 2022-12-25 ENCOUNTER — Ambulatory Visit
Admission: RE | Admit: 2022-12-25 | Discharge: 2022-12-25 | Disposition: A | Discharge status: Home / Self Care | Payer: BC Managed Care – PPO | Source: Ambulatory Visit | Attending: Acute Care | Admitting: Acute Care

## 2022-12-25 DIAGNOSIS — Z122 Encounter for screening for malignant neoplasm of respiratory organs: Secondary | ICD-10-CM | POA: Diagnosis present

## 2022-12-25 DIAGNOSIS — Z87891 Personal history of nicotine dependence: Secondary | ICD-10-CM | POA: Insufficient documentation

## 2023-01-09 ENCOUNTER — Other Ambulatory Visit: Payer: Self-pay

## 2023-01-09 DIAGNOSIS — Z122 Encounter for screening for malignant neoplasm of respiratory organs: Secondary | ICD-10-CM

## 2023-01-09 DIAGNOSIS — Z87891 Personal history of nicotine dependence: Secondary | ICD-10-CM

## 2023-01-31 ENCOUNTER — Ambulatory Visit: Payer: Self-pay

## 2023-01-31 NOTE — Telephone Encounter (Signed)
  Chief Complaint: cough Symptoms: productive cough, sinus pressure, body aches, fatigue Frequency: since 1st of Oct Pertinent Negatives: Patient denies fever Disposition: [] ED /[x] Urgent Care (no appt availability in office) / [] Appointment(In office/virtual)/ []  [facility]/ [] Home Care/ [] Refused Recommended Disposition /[] [facility]/ []  Follow-up with PCP Additional Notes: pt's wife said that pt hasn't been seen since sx began. He has been taking OTC meds but not helping. No appts until 02/13/23. Scheduled UC appt today at 1730. Care advice given and wife verbalized understanding.    Summary: coughing up stuff/drainage   Patient's wife Ashok Cordia has called and states patient has been sick for a while, since last month, he is coughing up stuff, not feeling well, drainage.  Patient was last seen at [facility] 11/09/21. No appt's with [facility].  Patient does not want to see Debera Lat or Merita Norton if scheduled.  Please advise and contact back @ phone #[phone]      Reason for Disposition  [1] Continuous (nonstop) coughing interferes with work or school AND [2] no improvement using cough treatment per Care Advice  Answer Assessment - Initial Assessment Questions 1. ONSET: "When did the cough begin?"      Since Oct 1st 2. SEVERITY: "How bad is the cough today?"      Productive and  3. SPUTUM: "Describe the color of your sputum" (none, dry cough; clear, white, yellow, green)     Wife didn't say 5. DIFFICULTY BREATHING: "Are you having difficulty breathing?" If Yes, ask: "How bad is it?" (e.g., mild, moderate, severe)    - MILD: No SOB at rest, mild SOB with walking, speaks normally in sentences, can lie down, no retractions, pulse < 100.    - MODERATE: SOB at rest, SOB with minimal exertion and prefers to sit, cannot lie down flat, speaks in phrases, mild retractions, audible wheezing, pulse 100-120.    - SEVERE: Very SOB at rest, speaks in single words, struggling to  breathe, sitting hunched forward, retractions, pulse > 120      Mild at times 10. OTHER SYMPTOMS: "Do you have any other symptoms?" (e.g., runny nose, wheezing, chest pain)       Congestion, sinus pressure, body aches and fatigue  Protocols used: Cough - Acute Productive-A-AH

## 2023-02-02 NOTE — Telephone Encounter (Signed)
Patient gets one PCP switch. Can switch to anyone accepting new patients. So other options are Caryl Asp, Dr Demetrius Charity and Dr Elvera Lennox.

## 2023-02-21 ENCOUNTER — Other Ambulatory Visit: Payer: Self-pay | Admitting: Physician Assistant

## 2023-02-21 DIAGNOSIS — J324 Chronic pansinusitis: Secondary | ICD-10-CM

## 2023-02-28 ENCOUNTER — Ambulatory Visit: Payer: BC Managed Care – PPO | Admitting: Student in an Organized Health Care Education/Training Program

## 2023-02-28 ENCOUNTER — Encounter: Payer: Self-pay | Admitting: Student in an Organized Health Care Education/Training Program

## 2023-02-28 VITALS — BP 130/88 | HR 85 | Temp 97.7°F | Ht 68.0 in | Wt 159.0 lb

## 2023-02-28 DIAGNOSIS — J4540 Moderate persistent asthma, uncomplicated: Secondary | ICD-10-CM | POA: Diagnosis not present

## 2023-02-28 DIAGNOSIS — J438 Other emphysema: Secondary | ICD-10-CM | POA: Diagnosis not present

## 2023-02-28 DIAGNOSIS — J449 Chronic obstructive pulmonary disease, unspecified: Secondary | ICD-10-CM | POA: Diagnosis not present

## 2023-02-28 DIAGNOSIS — R0602 Shortness of breath: Secondary | ICD-10-CM | POA: Diagnosis not present

## 2023-02-28 DIAGNOSIS — J439 Emphysema, unspecified: Secondary | ICD-10-CM

## 2023-02-28 LAB — NITRIC OXIDE: Nitric Oxide: 31

## 2023-02-28 NOTE — Progress Notes (Signed)
Assessment & Plan:   #COPD/Asthma Overlap #COPD (GOLD IIA)  Presents for follow up on shortness of breath, cough, and wheeze. PFT's performed 01/2022 showed obstruction with a reversible component with associated FENO being previously significantly elevated at 81 ppb during the November visit. This was overall consistent with asthm/COPD overlap, and we initiated him on triple therapy with ICS/LABA/LAMA. FENO today is improved significantly and is down to 31 ppb. Patient is tolerating his inhaler and feels improved with it. Will consider decreasing the ICS dose in trelegy on follow up.  -continue Trelegy 200-62.5-25 -full smoking cessation encouraged -FENO improved to 31 ppb today -Continue LDCT for lung cancer screening -continue Montelukast -continue nasal ICS  #Emphysema #Large Left Apical Bleb  He was enrolled in LDCT for lung cancer screening and was noted to have severe emphysema including a large left apical bleb. Patient has not had any history of pneumothorax and his current symptom burden is minimal. On my review, I don't note any mediastinal shifts. Given this, I would favor a conservative approach for this left sided bulla with clinical monitoring. Should he develop worsening symptoms, mediastinal shift, or pneumothorax, he would then require consultation with thoracic surgery for blebectomy. I will test for alpha-1-antitrypsin deficiency for completion of workup. Finally, he does have a family history of CAD (father died of ACS in his 51's). LDCT showing coronary artery calcifications for which I will refer him to cardiology. I have also asked Mr. Kittler to re-establish care with PCP  - Alpha-1-antitrypsin; Future - Ambulatory referral to Cardiology   Return in about 6 months (around 08/29/2023).  I spent 30 minutes caring for this patient today, including preparing to see the patient, obtaining a medical history , reviewing a separately obtained history, performing a  medically appropriate examination and/or evaluation, counseling and educating the patient/family/caregiver, ordering medications, tests, or procedures, documenting clinical information in the electronic health record, and independently interpreting results (not separately reported/billed) and communicating results to the patient/family/caregiver  Raechel Chute, MD [facility]  End of visit medications:  No orders of the defined types were placed in this encounter.    Current Outpatient Medications:    albuterol (VENTOLIN HFA) 108 (90 Base) MCG/ACT inhaler, INHALE 1-2 PUFFS BY MOUTH EVERY 6 HOURS AS NEEDED FOR WHEEZE OR SHORTNESS OF BREATH, Disp: 18 each, Rfl: 3   Azelastine HCl 0.15 % SOLN, PLACE 1 SPRAY INTO THE NOSE IN THE MORNING AND AT BEDTIME., Disp: 30 mL, Rfl: 0   EPINEPHrine 0.3 mg/0.3 mL IJ SOAJ injection, Inject 0.3 mg into the muscle as needed., Disp: 2 each, Rfl: 0   fluticasone (FLONASE) 50 MCG/ACT nasal spray, Place 1 spray into both nostrils as needed., Disp: 16 g, Rfl: 0   Fluticasone-Umeclidin-Vilant (TRELEGY ELLIPTA) 200-62.5-25 MCG/ACT AEPB, Inhale 1 puff into the lungs daily., Disp: 30 each, Rfl: 12   levocetirizine (XYZAL) 5 MG tablet, TAKE 1 TABLET BY MOUTH EVERY DAY IN THE EVENING, Disp: 90 tablet, Rfl: 0   montelukast (SINGULAIR) 10 MG tablet, TAKE 1 TABLET BY MOUTH EVERYDAY AT BEDTIME, Disp: 90 tablet, Rfl: 3   pantoprazole (PROTONIX) 40 MG tablet, Take 1 tablet (40 mg total) by mouth 2 (two) times daily., Disp: 60 tablet, Rfl: 0   Subjective:   PATIENT ID: Eilleen Kempf GENDER: male DOB: 08-25-1970, MRN: [id number]  Chief Complaint  Patient presents with   Follow-up    Occasional cough and shortness of breath on exertion. No wheezing.  HPI  Mr. Dandurand is a pleasant 52 year old presenting to clinic for follow up.   Since our last visit, he's felt better. He has been using Trelegy and has tolerated it well. Shortness of breath is  improved, but not fully resolved. He quit smoking and was use a nicotine vape occasionally and is working on full cessation.    He does have a longstanding history of recurrent rhinitis and sinusitis for which he is now seeing ENT and allergy. He's followed with ENT as well as an allergist without much improvement in his symptoms. He feels a continuous need to clear his throat/cough secondary to continued drainage from his sinuses.    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 They have a new dog. He does report the use of nicotine vapes but is using it once a week or every other week, and is currently using a non-nicotine vape and is working to quit. No report of mold at home.   Ancillary information including prior medications, full medical/surgical/family/social histories, and PFTs (when available) are listed below and have been reviewed.   Review of Systems  Constitutional:  Negative for chills, diaphoresis, fever, malaise/fatigue and weight loss.  HENT:  Positive for congestion. Negative for nosebleeds and sore throat.   Eyes:  Negative for blurred vision.  Respiratory:  Positive for shortness of breath. Negative for cough, hemoptysis, sputum production, wheezing and stridor.   Cardiovascular:  Negative for chest pain, palpitations, orthopnea, leg swelling and PND.  Neurological:  Negative for dizziness and weakness.     Objective:   Vitals:   02/28/23 0832  BP: 130/88  Pulse: 85  Temp: 97.7 F (36.5 C)  TempSrc: Temporal  SpO2: 98%  Weight: 159 lb (72.1 kg)  Height: 5\' 8"  (1.727 m)   98% on RA BMI Readings from Last 3 Encounters:  02/28/23 24.18 kg/m  11/29/22 24.81 kg/m  02/13/22 24.88 kg/m   Wt Readings from Last 3 Encounters:  02/28/23 159 lb (72.1 kg)  11/29/22 163 lb 3.2 oz (74 kg)  02/13/22 163 lb 9.6 oz (74.2 kg)    Physical Exam Constitutional:      Appearance: Normal appearance.  HENT:     Head: Normocephalic.     Mouth/Throat:     Mouth:  Mucous membranes are moist.  Eyes:     Pupils: Pupils are equal, round, and reactive to light.  Cardiovascular:     Rate and Rhythm: Normal rate and regular rhythm.     Pulses: Normal pulses.     Heart sounds: Normal heart sounds.  Pulmonary:     Effort: Pulmonary effort is normal.     Breath sounds: Normal breath sounds.  Abdominal:     General: Abdomen is flat.     Palpations: Abdomen is soft.  Musculoskeletal:        General: Normal range of motion.     Cervical back: Normal range of motion.  Skin:    General: Skin is warm.  Neurological:     General: No focal deficit present.     Mental Status: He is alert and oriented to person, place, and time.       Ancillary Information    Past Medical History:  Diagnosis Date   Allergic rhinitis    Hyperlipidemia    Hypertension      Family History  Problem Relation Age of Onset   Healthy Mother    Congestive Heart Failure Father    Healthy Sister    Healthy  Daughter      Past Surgical History:  Procedure Laterality Date   ESOPHAGOGASTRODUODENOSCOPY N/A 11/17/2020   Procedure: ESOPHAGOGASTRODUODENOSCOPY (EGD) with possible intervention;  Surgeon: Jaynie Collins, DO;  Location: [facility] ENDOSCOPY;  Service: Gastroenterology;  Laterality: N/A;  food impaction   NO PAST SURGERIES      Social History   Socioeconomic History   Marital status: Married    Spouse name: Not on file   Number of children: Not on file   Years of education: Not on file   Highest education level: Not on file  Occupational History   Not on file  Tobacco Use   Smoking status: Former    Current packs/day: 0.00    Average packs/day: 1.5 packs/day for 31.0 years (46.5 ttl pk-yrs)    Types: Cigarettes    Quit date: 03/14/2019    Years since quitting: 3.9   Smokeless tobacco: Never  Vaping Use   Vaping status: Every Day  Substance and Sexual Activity   Alcohol use: Yes    Comment: occasional alcohol use, drinks beer on weekends maybe once a  week   Drug use: No   Sexual activity: Not on file  Other Topics Concern   Not on file  Social History Narrative   Not on file   Social Drivers of Health   Financial Resource Strain: Not on file  Food Insecurity: Not on file  Transportation Needs: Not on file  Physical Activity: Not on file  Stress: Not on file  Social Connections: Not on file  Intimate Partner Violence: Not on file     Allergies  Allergen Reactions   Lisinopril Palpitations    Other reaction(s): Rapid pulse High blood pressure      CBC    Component Value Date/Time   WBC 7.7 11/16/2020 2130   RBC 5.10 11/16/2020 2130   HGB 15.9 11/16/2020 2130   HGB 17.8 (H) 05/15/2017 0806   HCT 43.5 11/16/2020 2130   HCT 50.2 05/15/2017 0806   PLT 279 11/16/2020 2130   PLT 248 05/15/2017 0806   MCV 85.3 11/16/2020 2130   MCV 87 05/15/2017 0806   MCH 31.2 11/16/2020 2130   MCHC 36.6 (H) 11/16/2020 2130   RDW 11.2 (L) 11/16/2020 2130   RDW 12.7 05/15/2017 0806   LYMPHSABS 3.2 11/16/2020 2130   LYMPHSABS 3.9 (H) 05/15/2017 0806   MONOABS 0.5 11/16/2020 2130   EOSABS 0.4 11/16/2020 2130   EOSABS 0.2 05/15/2017 0806   BASOSABS 0.1 11/16/2020 2130   BASOSABS 0.0 05/15/2017 0806    Pulmonary Functions Testing Results:    Latest Ref Rng & Units 01/19/2022    2:32 PM  PFT Results  FVC-Pre L 3.40   FVC-Predicted Pre % 71   FVC-Post L 3.72   FVC-Predicted Post % 78   Pre FEV1/FVC % % 55   Post FEV1/FCV % % 59   FEV1-Pre L 1.87   FEV1-Predicted Pre % 50   FEV1-Post L 2.18   DLCO uncorrected ml/min/mmHg 16.70   DLCO UNC% % 60   DLVA Predicted % 63   TLC L 7.43   TLC % Predicted % 113   RV % Predicted % 182     Outpatient Medications Prior to Visit  Medication Sig Dispense Refill   albuterol (VENTOLIN HFA) 108 (90 Base) MCG/ACT inhaler INHALE 1-2 PUFFS BY MOUTH EVERY 6 HOURS AS NEEDED FOR WHEEZE OR SHORTNESS OF BREATH 18 each 3   Azelastine HCl 0.15 % SOLN PLACE 1 SPRAY  INTO THE NOSE IN THE MORNING AND  AT BEDTIME. 30 mL 0   EPINEPHrine 0.3 mg/0.3 mL IJ SOAJ injection Inject 0.3 mg into the muscle as needed. 2 each 0   fluticasone (FLONASE) 50 MCG/ACT nasal spray Place 1 spray into both nostrils as needed. 16 g 0   Fluticasone-Umeclidin-Vilant (TRELEGY ELLIPTA) 200-62.5-25 MCG/ACT AEPB Inhale 1 puff into the lungs daily. 30 each 12   levocetirizine (XYZAL) 5 MG tablet TAKE 1 TABLET BY MOUTH EVERY DAY IN THE EVENING 90 tablet 0   montelukast (SINGULAIR) 10 MG tablet TAKE 1 TABLET BY MOUTH EVERYDAY AT BEDTIME 90 tablet 3   pantoprazole (PROTONIX) 40 MG tablet Take 1 tablet (40 mg total) by mouth 2 (two) times daily. 60 tablet 0   pantoprazole (PROTONIX) 40 MG tablet Take 1 tablet (40 mg total) by mouth 2 (two) times daily. Take 30 minutes before breakfast and supper 180 tablet 1   No facility-administered medications prior to visit.

## 2023-04-01 IMAGING — CR DG CHEST 2V
1 series · 2 of 2 positions shown · non-contrast
Comparison: 03/18/2020

CLINICAL DATA: Food impaction, dyspnea

EXAM:
CHEST - 2 VIEW

[Series 1: dg chest 2 view · 0.14mm/px · 2 of 2 slices shown]
[im 1/2]
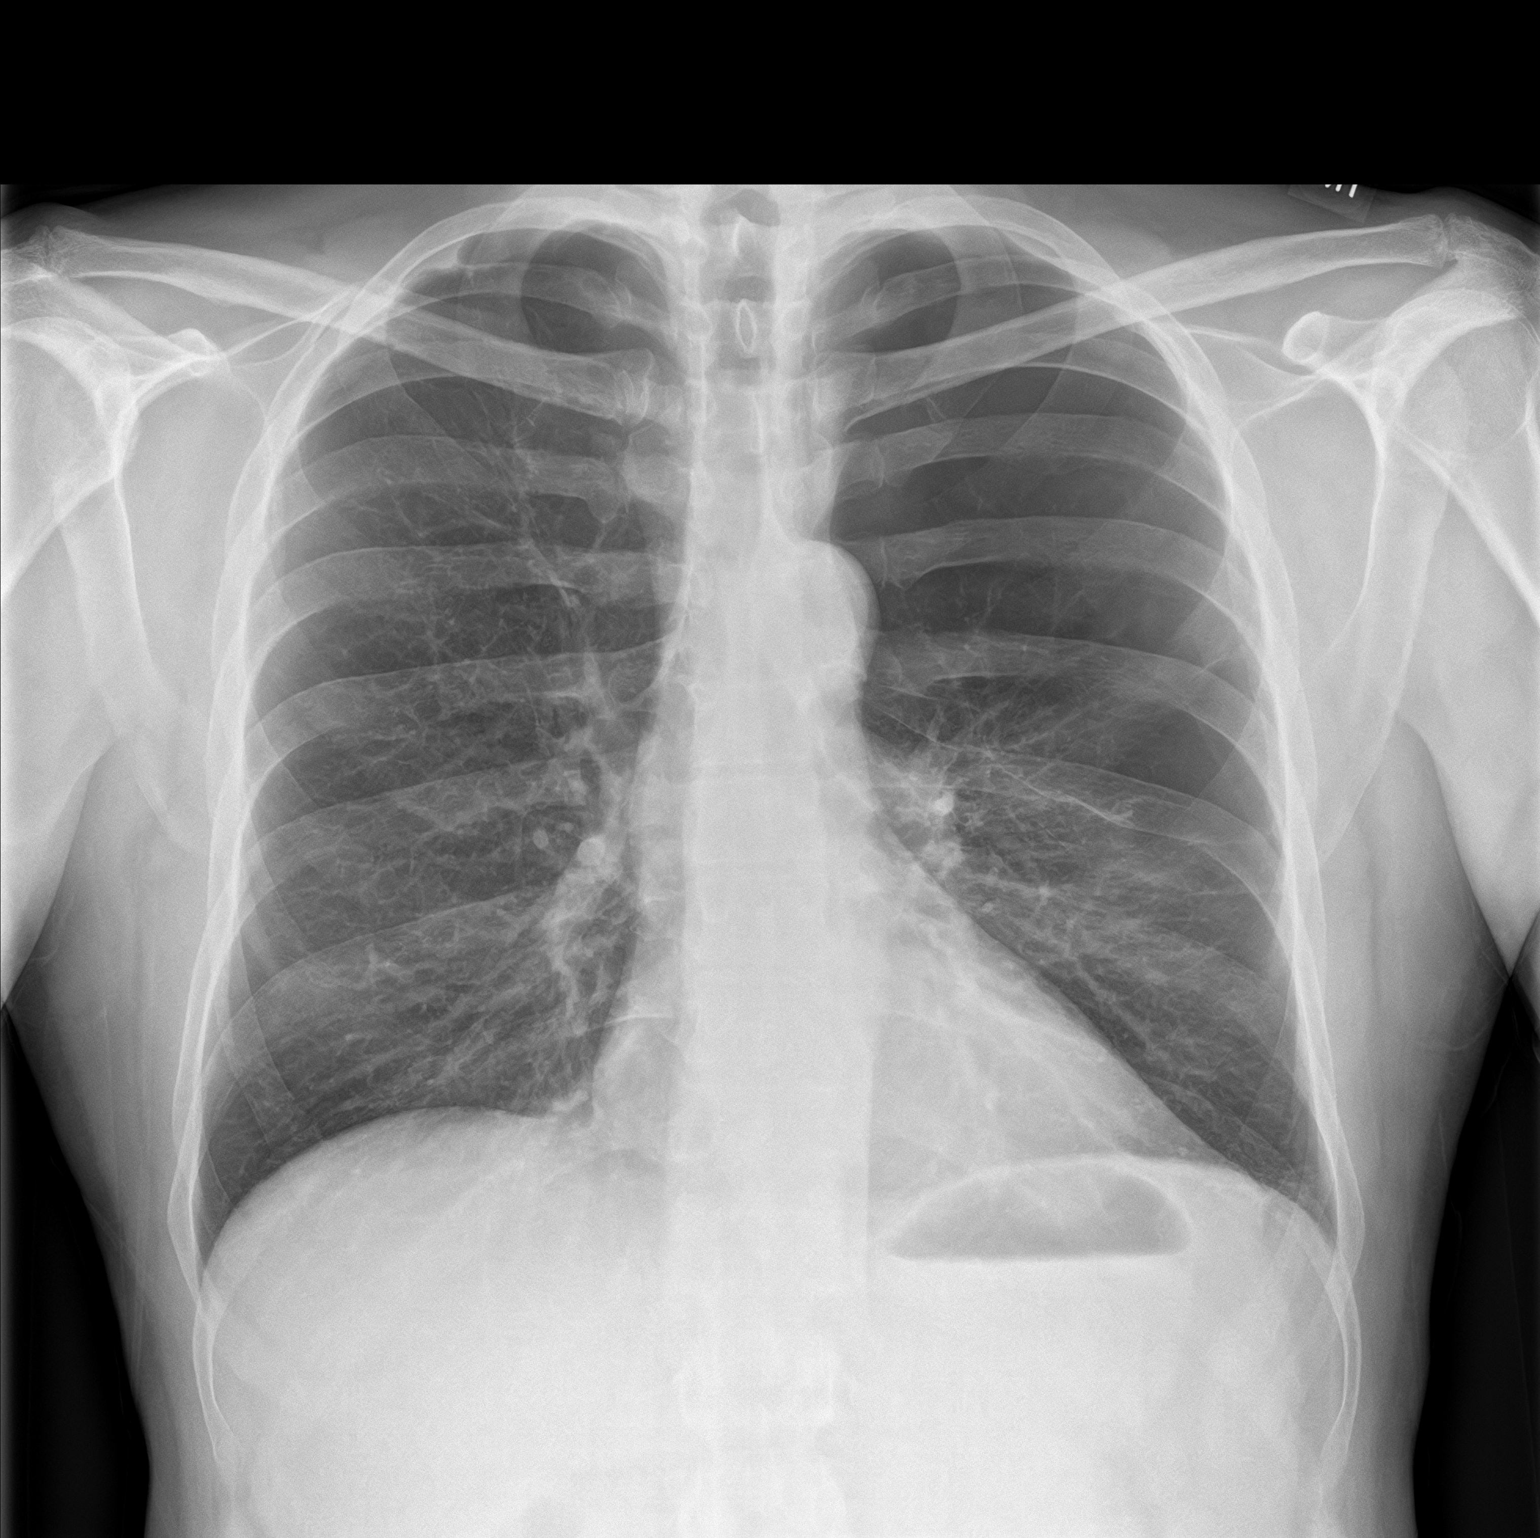
[im 2/2]
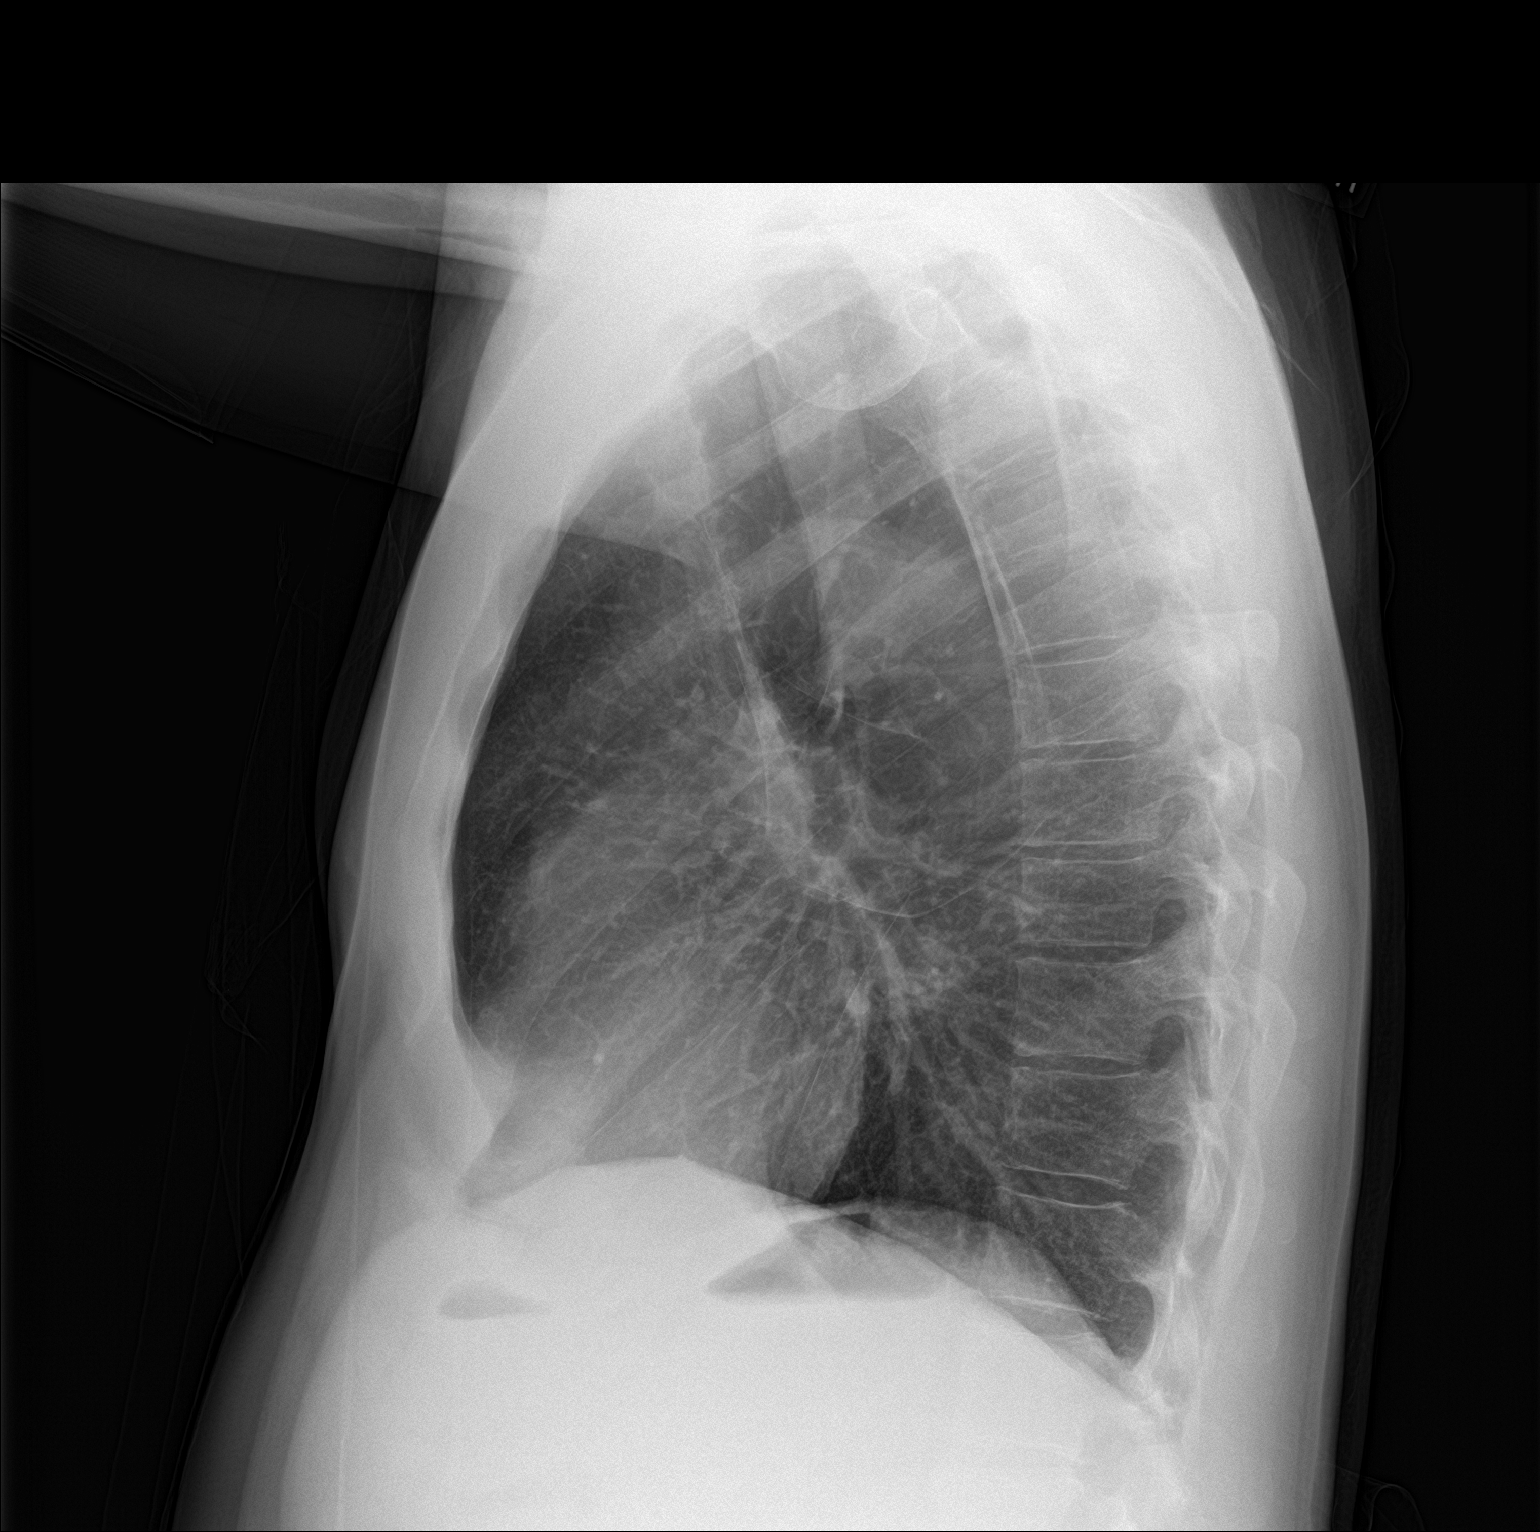

[2 of 2 positions shown; findings below may reference images not displayed]

FINDINGS: Changes of bullous emphysema are again identified with asymmetric
extensive bulla formation within the left apex, unchanged from prior
examination. No superimposed focal pulmonary infiltrate. No definite
pneumothorax. No pleural effusion. Cardiac size within normal
limits. No acute bone abnormality.
IMPRESSION: No active cardiopulmonary disease. Stable changes of bullous
emphysema, asymmetrically more severe within the left apex.

## 2023-05-04 ENCOUNTER — Encounter: Payer: Self-pay | Admitting: Cardiology

## 2023-05-04 ENCOUNTER — Ambulatory Visit: Payer: 59 | Attending: Cardiology | Admitting: Cardiology

## 2023-05-04 VITALS — BP 144/100 | HR 78 | Ht 68.0 in | Wt 158.8 lb

## 2023-05-04 DIAGNOSIS — I2510 Atherosclerotic heart disease of native coronary artery without angina pectoris: Secondary | ICD-10-CM | POA: Diagnosis not present

## 2023-05-04 DIAGNOSIS — R0600 Dyspnea, unspecified: Secondary | ICD-10-CM | POA: Diagnosis not present

## 2023-05-04 DIAGNOSIS — I10 Essential (primary) hypertension: Secondary | ICD-10-CM

## 2023-05-04 MED ORDER — ASPIRIN 81 MG PO TBEC: 81.0000 mg | DELAYED_RELEASE_TABLET | Freq: Every day | ORAL | Status: AC

## 2023-05-04 MED ORDER — ATORVASTATIN CALCIUM 20 MG PO TABS: 20.0000 mg | ORAL_TABLET | Freq: Every day | ORAL | 3 refills | Status: DC

## 2023-05-04 MED ORDER — METOPROLOL TARTRATE 100 MG PO TABS: 100.0000 mg | ORAL_TABLET | Freq: Once | ORAL | 0 refills | Status: DC

## 2023-05-04 NOTE — Progress Notes (Signed)
Cardiology Office Note:    Date:  05/04/2023   ID:  [name], DOB 27-Dec-1970, MRN [id number]  PCP:  [facility]    HeartCare Providers Cardiologist:  Debbe Odea, MD     Referring MD: Raechel Chute, MD   Chief Complaint  Patient presents with   New Patient (Initial Visit)    Referred for cardiac evaluation of shortness of breath.  Patient reports dyspnea on exertion for past couple of years.  Does see pulmonology that recommended cardiac eval due to low dose CT screening in 12/2022 for Severe coronary artery calcifications, recommend ASCVD risk assessment.    History of Present Illness:    Kyle Clark is a 53 y.o. male with a hx of  CAD (LAD, Lcx calcifications on chest CT 10/24), COPD, former smoker x 30+ years presenting with shortness of breath with exertion.  Patient endorsed shortness of breath with exertion ongoing over several months.  Followed up with pulmonary medicine, diagnosed with asthma and COPD.  He denies chest pain with exertion.  Quit smoking 45 years ago.  Was previously on BP medications, but blood pressure normalized and patient currently being monitored off BP meds.  CT chest 12/2022 for lung cancer screening showed LAD and RCA calcifications  Past Medical History:  Diagnosis Date   Allergic rhinitis    Hyperlipidemia    Hypertension     Past Surgical History:  Procedure Laterality Date   ELBOW SURGERY     01/2021   ESOPHAGOGASTRODUODENOSCOPY N/A 11/17/2020   Procedure: ESOPHAGOGASTRODUODENOSCOPY (EGD) with possible intervention;  Surgeon: Jaynie Collins, DO;  Location: [facility] ENDOSCOPY;  Service: Gastroenterology;  Laterality: N/A;  food impaction    Current Medications: Current Meds  Medication Sig   albuterol (VENTOLIN HFA) 108 (90 Base) MCG/ACT inhaler INHALE 1-2 PUFFS BY MOUTH EVERY 6 HOURS AS NEEDED FOR WHEEZE OR SHORTNESS OF BREATH   aspirin EC 81 MG tablet Take 1 tablet (81 mg total) by  mouth daily. Swallow whole.   atorvastatin (LIPITOR) 20 MG tablet Take 1 tablet (20 mg total) by mouth daily.   Azelastine HCl 0.15 % SOLN PLACE 1 SPRAY INTO THE NOSE IN THE MORNING AND AT BEDTIME.   EPINEPHrine 0.3 mg/0.3 mL IJ SOAJ injection Inject 0.3 mg into the muscle as needed.   fluticasone (FLONASE) 50 MCG/ACT nasal spray Place 1 spray into both nostrils as needed.   Fluticasone-Umeclidin-Vilant (TRELEGY ELLIPTA) 200-62.5-25 MCG/ACT AEPB Inhale 1 puff into the lungs daily.   levocetirizine (XYZAL) 5 MG tablet TAKE 1 TABLET BY MOUTH EVERY DAY IN THE EVENING   metoprolol tartrate (LOPRESSOR) 100 MG tablet Take 1 tablet (100 mg total) by mouth once for 1 dose. TAKE TWO HOURS PRIOR TO CARDIAC CT   montelukast (SINGULAIR) 10 MG tablet TAKE 1 TABLET BY MOUTH EVERYDAY AT BEDTIME   pantoprazole (PROTONIX) 40 MG tablet Take 1 tablet (40 mg total) by mouth 2 (two) times daily.     Allergies:   Lisinopril   Social History   Socioeconomic History   Marital status: Married    Spouse name: Not on file   Number of children: Not on file   Years of education: Not on file   Highest education level: Not on file  Occupational History   Not on file  Tobacco Use   Smoking status: Former    Current packs/day: 0.00    Average packs/day: 1.5 packs/day for 31.0 years (46.5 ttl pk-yrs)    Types: Cigarettes  Quit date: 03/14/2019    Years since quitting: 4.1   Smokeless tobacco: Never  Vaping Use   Vaping status: Every Day  Substance and Sexual Activity   Alcohol use: Yes    Comment: occasional alcohol use, drinks beer on weekends maybe once a week   Drug use: No   Sexual activity: Not on file  Other Topics Concern   Not on file  Social History Narrative   Not on file   Social Drivers of Health   Financial Resource Strain: Not on file  Food Insecurity: Not on file  Transportation Needs: Not on file  Physical Activity: Not on file  Stress: Not on file  Social Connections: Not on file      Family History: The patient's family history includes Congestive Heart Failure in his father; Healthy in his daughter, mother, and sister.  ROS:   Please see the history of present illness.     All other systems reviewed and are negative.  EKGs/Labs/Other Studies Reviewed:    The following studies were reviewed today:  EKG Interpretation Date/Time:  Friday May 04 2023 15:07:17 EST Ventricular Rate:  77 PR Interval:  178 QRS Duration:  96 QT Interval:  372 QTC Calculation: 420 R Axis:   23  Text Interpretation: Sinus rhythm with sinus arrhythmia with occasional Premature ventricular complexes Incomplete right bundle branch block Confirmed by Debbe Odea ([id number]) on 05/04/2023 3:10:48 PM    Recent Labs: No results found for requested labs within last 365 days.  Recent Lipid Panel    Component Value Date/Time   CHOL 193 05/15/2017 0806   TRIG 134 05/15/2017 0806   HDL 41 05/15/2017 0806   CHOLHDL 4.7 05/15/2017 0806   LDLCALC 125 (H) 05/15/2017 0806     Risk Assessment/Calculations:         Physical Exam:    VS:  BP (!) 144/100 (BP Location: Left Arm, Patient Position: Sitting, Cuff Size: Normal)   Pulse 78   Ht 5\' 8"  (1.727 m)   Wt 158 lb 12.8 oz (72 kg)   SpO2 98%   BMI 24.15 kg/m     Wt Readings from Last 3 Encounters:  05/04/23 158 lb 12.8 oz (72 kg)  02/28/23 159 lb (72.1 kg)  11/29/22 163 lb 3.2 oz (74 kg)     GEN:  Well nourished, well developed in no acute distress HEENT: Normal NECK: No JVD; No carotid bruits CARDIAC: RRR, no murmurs, rubs, gallops RESPIRATORY:  Clear to auscultation without rales, wheezing or rhonchi  ABDOMEN: Soft, non-tender, non-distended MUSCULOSKELETAL:  No edema; No deformity  SKIN: Warm and dry NEUROLOGIC:  Alert and oriented x 3 PSYCHIATRIC:  Normal affect   ASSESSMENT:    1. Dyspnea, unspecified type   2. Coronary artery disease involving native coronary artery of native heart, unspecified whether  angina present   3. Primary hypertension    PLAN:    In order of problems listed above:  Dyspnea on exertion, coronary calcification, this could be an anginal equivalent although COPD/emphysema could be contributing.  Obtain echocardiogram, obtain coronary CTA to rule out obstructive CAD. Coronary calcifications on chest CT.  Echo and coronary CT as above.  Start aspirin 81 mg daily, Lipitor 20 mg daily.  Obtain lipid panel. BP elevated today, usually controlled off medications.  Advised to monitor BP at home and keep log.  Follow-up after cardiac testing.      Medication Adjustments/Labs and Tests Ordered: Current medicines are reviewed at length with the  patient today.  Concerns regarding medicines are outlined above.  Orders Placed This Encounter  Procedures   CT CORONARY MORPH W/CTA COR W/SCORE W/CA W/CM &/OR WO/CM   Basic Metabolic Panel (BMET)   EKG 12-Lead   ECHOCARDIOGRAM COMPLETE   Meds ordered this encounter  Medications   metoprolol tartrate (LOPRESSOR) 100 MG tablet    Sig: Take 1 tablet (100 mg total) by mouth once for 1 dose. TAKE TWO HOURS PRIOR TO CARDIAC CT    Dispense:  1 tablet    Refill:  0   aspirin EC 81 MG tablet    Sig: Take 1 tablet (81 mg total) by mouth daily. Swallow whole.   atorvastatin (LIPITOR) 20 MG tablet    Sig: Take 1 tablet (20 mg total) by mouth daily.    Dispense:  90 tablet    Refill:  3    Patient Instructions  Medication Instructions:   START Aspirin - Take one tablet ( 81mg ) by mouth daily.  START Atorvastatin - Take one tablet ( 20mg ) by mouth daily.   *If you need a refill on your cardiac medications before your next appointment, please call your pharmacy*   Lab Work:  BMP  If you have labs (blood work) drawn today and your tests are completely normal, you will receive your results only by: MyChart Message (if you have MyChart) OR A paper copy in the mail If you have any lab test that is abnormal or we need to change  your treatment, we will call you to review the results.   Testing/Procedures:  Your physician has requested that you have an echocardiogram. Echocardiography is a painless test that uses sound waves to create images of your heart. It provides your doctor with information about the size and shape of your heart and how well your heart's chambers and valves are working. This procedure takes approximately one hour. There are no restrictions for this procedure. Please do NOT wear cologne, perfume, aftershave, or lotions (deodorant is allowed). Please arrive 15 minutes prior to your appointment time.  Please note: We ask at that you not bring children with you during ultrasound (echo/ vascular) testing. Due to room size and safety concerns, children are not allowed in the ultrasound rooms during exams. Our front office staff cannot provide observation of children in our lobby area while testing is being conducted. An adult accompanying a patient to their appointment will only be allowed in the ultrasound room at the discretion of the ultrasound technician under special circumstances. We apologize for any inconvenience.    Your cardiac CT will be scheduled at one of the below locations:   [facility] [address] [place], [place] [address] [phone]  OR   [facility] [address] [place], [place] [address] [phone]  If scheduled at [facility] or [facility], please arrive 15 mins early for check-in and test prep.  There is spacious parking and easy access to the radiology department from the [facility] entrance. Please enter here and check-in with the desk attendant.   If scheduled at [facility], please arrive 30 minutes early for check-in and test prep.  Please follow these instructions carefully (unless otherwise directed):  An IV will  be required for this test and Nitroglycerin will be given.  Hold all erectile dysfunction medications at least 3 days (72 hrs) prior to test. (Ie viagra, cialis, sildenafil, tadalafil, etc)  On the Night Before the Test: Be sure to Drink plenty of water. Do not consume any caffeinated/decaffeinated beverages or chocolate 12 hours prior to your test. Do not take any antihistamines 12 hours prior to your test.  On the Day of the Test: Drink plenty of water until 1 hour prior to the test. Do not eat any food 1 hour prior to test. You may take your regular medications prior to the test.  Take metoprolol (Lopressor) two hours prior to test. Patients who wear a continuous glucose monitor MUST remove the device prior to scanning.      After the Test: Drink plenty of water. After receiving IV contrast, you may experience a mild flushed feeling. This is normal. On occasion, you may experience a mild rash up to 24 hours after the test. This is not dangerous. If this occurs, you can take Benadryl 25 mg, Zyrtec, Claritin, or Allegra and increase your fluid intake. (Patients taking Tikosyn should avoid Benadryl, and may take Zyrtec, Claritin, or Allegra) If you experience trouble breathing, this can be serious. If it is severe call [phone] IMMEDIATELY. If it is mild, please call our office.  We will call to schedule your test 2-4 weeks out understanding that some insurance companies will need an authorization prior to the service being performed.   For more information and frequently asked questions, please visit our website : [email or web address]  For non-scheduling related questions, please contact the cardiac imaging nurse navigator should you have any questions/concerns: Cardiac Imaging Nurse Navigators Direct Office Dial: [phone]   For scheduling needs, including cancellations and rescheduling, please call [place], [phone].   Follow-Up: At [facility], you and  your health needs are our priority.  As part of our continuing mission to provide you with exceptional heart care, we have created designated Provider Care Teams.  These Care Teams include your primary Cardiologist (physician) and Advanced Practice Providers (APPs -  Physician Assistants and Nurse Practitioners) who all work together to provide you with the care you need, when you need it.  We recommend signing up for the patient portal called "MyChart".  Sign up information is provided on this After Visit Summary.  MyChart is used to connect with patients for Virtual Visits (Telemedicine).  Patients are able to view lab/test results, encounter notes, upcoming appointments, etc.  Non-urgent messages can be sent to your provider as well.   To learn more about what you can do with MyChart, go to [email or web address].    Your next appointment:   6 -  week(s)  Provider:   You may see Debbe Odea, MD or one of the following Advanced Practice Providers on your designated Care Team:   Nicolasa Ducking, NP Eula Listen, PA-C Cadence Fransico Michael, PA-C Charlsie Quest, NP Carlos Levering, NP    Signed, Debbe Odea, MD  05/04/2023 4:18 PM    [facility]

## 2023-05-04 NOTE — Patient Instructions (Signed)
Medication Instructions:   START Aspirin - Take one tablet ( 81mg ) by mouth daily.  START Atorvastatin - Take one tablet ( 20mg ) by mouth daily.   *If you need a refill on your cardiac medications before your next appointment, please call your pharmacy*   Lab Work:  BMP  If you have labs (blood work) drawn today and your tests are completely normal, you will receive your results only by: MyChart Message (if you have MyChart) OR A paper copy in the mail If you have any lab test that is abnormal or we need to change your treatment, we will call you to review the results.   Testing/Procedures:  Your physician has requested that you have an echocardiogram. Echocardiography is a painless test that uses sound waves to create images of your heart. It provides your doctor with information about the size and shape of your heart and how well your heart's chambers and valves are working. This procedure takes approximately one hour. There are no restrictions for this procedure. Please do NOT wear cologne, perfume, aftershave, or lotions (deodorant is allowed). Please arrive 15 minutes prior to your appointment time.  Please note: We ask at that you not bring children with you during ultrasound (echo/ vascular) testing. Due to room size and safety concerns, children are not allowed in the ultrasound rooms during exams. Our front office staff cannot provide observation of children in our lobby area while testing is being conducted. An adult accompanying a patient to their appointment will only be allowed in the ultrasound room at the discretion of the ultrasound technician under special circumstances. We apologize for any inconvenience.    Your cardiac CT will be scheduled at one of the below locations:   [facility] [address] [place], [place] [address] [phone]  OR   [facility] [address] [place], [place]  [address] [phone]  If scheduled at [facility] or [facility], please arrive 15 mins early for check-in and test prep.  There is spacious parking and easy access to the radiology department from the [facility] entrance. Please enter here and check-in with the desk attendant.   If scheduled at [facility], please arrive 30 minutes early for check-in and test prep.  Please follow these instructions carefully (unless otherwise directed):  An IV will be required for this test and Nitroglycerin will be given.  Hold all erectile dysfunction medications at least 3 days (72 hrs) prior to test. (Ie viagra, cialis, sildenafil, tadalafil, etc)   On the Night Before the Test: Be sure to Drink plenty of water. Do not consume any caffeinated/decaffeinated beverages or chocolate 12 hours prior to your test. Do not take any antihistamines 12 hours prior to your test.  On the Day of the Test: Drink plenty of water until 1 hour prior to the test. Do not eat any food 1 hour prior to test. You may take your regular medications prior to the test.  Take metoprolol (Lopressor) two hours prior to test. Patients who wear a continuous glucose monitor MUST remove the device prior to scanning.      After the Test: Drink plenty of water. After receiving IV contrast, you may experience a mild flushed feeling. This is normal. On occasion, you may experience a mild rash up to 24 hours after the test. This is not dangerous. If this occurs, you can take Benadryl 25 mg, Zyrtec,  Claritin, or Allegra and increase your fluid intake. (Patients taking Tikosyn should avoid Benadryl, and may take Zyrtec, Claritin, or Allegra) If you experience trouble breathing, this can be serious. If it is severe call 911 IMMEDIATELY. If it is mild, please call our office.  We will call to schedule your test 2-4 weeks out understanding that some insurance companies will  need an authorization prior to the service being performed.   For more information and frequently asked questions, please visit our website : [email or web address]  For non-scheduling related questions, please contact the cardiac imaging nurse navigator should you have any questions/concerns: Cardiac Imaging Nurse Navigators Direct Office Dial: [phone]   For scheduling needs, including cancellations and rescheduling, please call [place], [phone].   Follow-Up: At [facility], you and your health needs are our priority.  As part of our continuing mission to provide you with exceptional heart care, we have created designated Provider Care Teams.  These Care Teams include your primary Cardiologist (physician) and Advanced Practice Providers (APPs -  Physician Assistants and Nurse Practitioners) who all work together to provide you with the care you need, when you need it.  We recommend signing up for the patient portal called "MyChart".  Sign up information is provided on this After Visit Summary.  MyChart is used to connect with patients for Virtual Visits (Telemedicine).  Patients are able to view lab/test results, encounter notes, upcoming appointments, etc.  Non-urgent messages can be sent to your provider as well.   To learn more about what you can do with MyChart, go to [email or web address].    Your next appointment:   6 -  week(s)  Provider:   You may see Debbe Odea, MD or one of the following Advanced Practice Providers on your designated Care Team:   Nicolasa Ducking, NP Eula Listen, PA-C Cadence Fransico Michael, PA-C Charlsie Quest, NP Carlos Levering, NP

## 2023-05-05 LAB — BASIC METABOLIC PANEL
BUN/Creatinine Ratio: 15 (ref 9–20)
BUN: 14 mg/dL (ref 6–24)
CO2: 20 mmol/L (ref 20–29)
Calcium: 9.5 mg/dL (ref 8.7–10.2)
Chloride: 105 mmol/L (ref 96–106)
Creatinine, Ser: 0.93 mg/dL (ref 0.76–1.27)
Glucose: 101 mg/dL — ABNORMAL HIGH (ref 70–99)
Potassium: 4.6 mmol/L (ref 3.5–5.2)
Sodium: 141 mmol/L (ref 134–144)
eGFR: 99 mL/min/{1.73_m2} (ref >59)

## 2023-05-15 ENCOUNTER — Encounter (HOSPITAL_COMMUNITY): Payer: Self-pay

## 2023-05-17 ENCOUNTER — Other Ambulatory Visit: Payer: Self-pay | Admitting: Cardiology

## 2023-05-17 ENCOUNTER — Ambulatory Visit
Admission: RE | Admit: 2023-05-17 | Discharge: 2023-05-17 | Disposition: A | Discharge status: Home / Self Care | Payer: 59 | Source: Ambulatory Visit | Attending: Cardiology | Admitting: Cardiology

## 2023-05-17 DIAGNOSIS — R931 Abnormal findings on diagnostic imaging of heart and coronary circulation: Secondary | ICD-10-CM

## 2023-05-17 DIAGNOSIS — I2510 Atherosclerotic heart disease of native coronary artery without angina pectoris: Secondary | ICD-10-CM | POA: Diagnosis present

## 2023-05-17 DIAGNOSIS — R0600 Dyspnea, unspecified: Secondary | ICD-10-CM | POA: Diagnosis present

## 2023-05-17 MED ORDER — NITROGLYCERIN 0.4 MG SL SUBL
0.8000 mg | SUBLINGUAL_TABLET | Freq: Once | SUBLINGUAL | Status: AC
  Administered 2023-05-17: 0.8 mg via SUBLINGUAL

## 2023-05-17 MED ORDER — SODIUM CHLORIDE 0.9 % IV BOLUS
150.0000 mL | Freq: Once | INTRAVENOUS | Status: AC
  Administered 2023-05-17: 150 mL via INTRAVENOUS

## 2023-05-17 MED ORDER — IOHEXOL 350 MG/ML SOLN
75.0000 mL | Freq: Once | INTRAVENOUS | Status: AC | PRN
  Administered 2023-05-17: 75 mL via INTRAVENOUS

## 2023-05-17 NOTE — Progress Notes (Signed)
 Patient tolerated CT well. Drank water after. Vital signs stable encourage to drink water throughout day.Reasons explained and verbalized understanding. Ambulated steady gait.

## 2023-05-22 NOTE — H&P (View-Only) (Signed)
 Cardiology Office Note    Date:  05/23/2023   ID:  RAESHAWN VO, DOB 17-Feb-1971, MRN [id number]  PCP:  [facility]  Cardiologist:  Debbe Odea, MD  Electrophysiologist:  None   Chief Complaint: Follow-up  History of Present Illness:   LEVELL TAVANO is a 53 y.o. male with history of CAD, aortic atherosclerosis, COPD, asthma, prior tobacco use quitting in 2021, HTN, and HLD who presents for follow-up of abnormal coronary CTA.  Prior CT, performed for lung cancer screening, in 12/2022 showed coronary artery calcifications involving the LAD and LCx.  He was seen as a new patient by Dr. Azucena Cecil on 05/04/2023 noting an approximate 2-year history of progressive exertional dyspnea that has been more noticeable as of late.  At that time, he also reported having previously been on medication for hypertension, though BP normalized and he was monitored off pharmacotherapy.  Blood pressure was elevated in the office at 144/100.  He was started on aspirin 81 mg and atorvastatin 20 mg.  Echo and coronary CTA were recommended.  Coronary CTA on 05/17/2023 showed a calcium score of 949, which was the 99th percentile.  There was greater than 70% proximal LAD stenosis with 25 to 49% proximal LCx and RCA stenoses.  ctFFR of 0.78 in the proximal to mid LAD with normal FFRct of the LCx and RCA.  Echo pending at this time.   He comes in accompanied by his wife today and continues to note exertional dyspnea as well as insomnia exertional chest discomfort and at times and intermittent sharp pinpoint chest discomfort that occurs randomly.  He will become short of breath with activities such as taking the trash cans out to the road.  With this, there is some associated fatigue as well.  He also feels like his abdomen is bloated at times.  He did quit smoking in 2021, though currently vapes.  He will drink a 12 pack of beer on a weekend night with friends and at times will drink 3-4 beers per  night during the week, though not every night.  Adherent and tolerating recently initiated aspirin and atorvastatin.   Labs independently reviewed: 04/2023 - BUN 14, serum creatinine 0.93, potassium 4.6 11/2020 - Hgb 15.9, PLT 279 05/2017 - TC 193, TG 134, HDL 41, LDL 125, albumin 4.8, AST/ALT normal, TSH normal  Past Medical History:  Diagnosis Date   Allergic rhinitis    CAD (coronary artery disease)    Hyperlipidemia    Hypertension     Past Surgical History:  Procedure Laterality Date   ELBOW SURGERY     01/2021   ESOPHAGOGASTRODUODENOSCOPY N/A 11/17/2020   Procedure: ESOPHAGOGASTRODUODENOSCOPY (EGD) with possible intervention;  Surgeon: Jaynie Collins, DO;  Location: [facility] ENDOSCOPY;  Service: Gastroenterology;  Laterality: N/A;  food impaction    Current Medications: Current Meds  Medication Sig   albuterol (VENTOLIN HFA) 108 (90 Base) MCG/ACT inhaler INHALE 1-2 PUFFS BY MOUTH EVERY 6 HOURS AS NEEDED FOR WHEEZE OR SHORTNESS OF BREATH   aspirin EC 81 MG tablet Take 1 tablet (81 mg total) by mouth daily. Swallow whole.   atorvastatin (LIPITOR) 20 MG tablet Take 1 tablet (20 mg total) by mouth daily.   Azelastine HCl 0.15 % SOLN PLACE 1 SPRAY INTO THE NOSE IN THE MORNING AND AT BEDTIME.   EPINEPHrine 0.3 mg/0.3 mL IJ SOAJ injection Inject 0.3 mg into the muscle as needed.   fluticasone (FLONASE) 50 MCG/ACT nasal spray Place 1 spray into  both nostrils as needed.   Fluticasone-Umeclidin-Vilant (TRELEGY ELLIPTA) 200-62.5-25 MCG/ACT AEPB Inhale 1 puff into the lungs daily.   levocetirizine (XYZAL) 5 MG tablet TAKE 1 TABLET BY MOUTH EVERY DAY IN THE EVENING   montelukast (SINGULAIR) 10 MG tablet TAKE 1 TABLET BY MOUTH EVERYDAY AT BEDTIME   pantoprazole (PROTONIX) 40 MG tablet Take 1 tablet (40 mg total) by mouth 2 (two) times daily.    Allergies:   Lisinopril   Social History   Socioeconomic History   Marital status: Married    Spouse name: Not on file   Number of  children: Not on file   Years of education: Not on file   Highest education level: Not on file  Occupational History   Not on file  Tobacco Use   Smoking status: Former    Current packs/day: 0.00    Average packs/day: 1.5 packs/day for 31.0 years (46.5 ttl pk-yrs)    Types: Cigarettes    Quit date: 03/14/2019    Years since quitting: 4.1   Smokeless tobacco: Never  Vaping Use   Vaping status: Every Day  Substance and Sexual Activity   Alcohol use: Yes    Comment: occasional alcohol use, drinks beer on weekends maybe once a week   Drug use: No   Sexual activity: Not on file  Other Topics Concern   Not on file  Social History Narrative   Not on file   Social Drivers of Health   Financial Resource Strain: Not on file  Food Insecurity: Not on file  Transportation Needs: Not on file  Physical Activity: Not on file  Stress: Not on file  Social Connections: Not on file     Family History:  The patient's family history includes CAD in his mother; Congestive Heart Failure in his father; Healthy in his daughter and sister.  ROS:   12-point review of systems is negative unless otherwise noted in the HPI.   EKGs/Labs/Other Studies Reviewed:    Studies reviewed were summarized above. The additional studies were reviewed today:  Coronary CTA 05/17/2023: FINDINGS: Aorta:  Normal size.  No calcifications.  No dissection.   Aortic Valve:  Trileaflet.  No calcifications.   Coronary Arteries:  Normal coronary origin.  Right dominance.   RCA is a dominant artery. There is calcified plaque proximally causing mild stenosis (25-49%).   Left main gives rise to LAD and LCX arteries. LM has no disease.   LAD has calcified plaque proximally causing severe stenosis (70%).   LCX is a non-dominant artery. There is calcified plaque proximally causing mild stenosis (25-49%).   Other findings:   Normal pulmonary vein drainage into the left atrium.   Normal left atrial appendage without  a thrombus.   Normal size of the pulmonary artery.   IMPRESSION: 1. Coronary calcium score of 949. This was 99th percentile for age and sex matched control.   2. Normal coronary origin with right dominance.   3. Severe proximal LAD stenosis (>70%).   4. Mild proximal RCA and LCx stenosis (25-49%).   5. CAD-RADS 4 Severe stenosis. (70-99% or > 50% left main). Cardiac catheterization is recommended. Consider symptom-guided anti-ischemic pharmacotherapy as well as risk factor modification per guideline directed care.   6. Additional analysis with CT FFR will be submitted and reported separately.   ctFFR: 1. Left Main:  No significant stenosis. 2. LAD: significant stenosis in proximal-mid segment.  FFRct 0.78 3. LCX: No significant stenosis.  FFRct 0.96 4. RCA: No significant stenosis.  FFRct 0.90   IMPRESSION: 1. CT FFR analysis showed significant stenosis in the proximal-mid LAD. FFRct 0.78 2.  Recommend cardiac catheterization.    EKG:  EKG is ordered today.  The EKG ordered today demonstrates sinus rhythm with sinus arrhythmia with multiform PVCs, 84 bpm, incomplete RBBB, consistent with prior tracing  Recent Labs: 05/04/2023: BUN 14; Creatinine, Ser 0.93; Potassium 4.6; Sodium 141  Recent Lipid Panel    Component Value Date/Time   CHOL 193 05/15/2017 0806   TRIG 134 05/15/2017 0806   HDL 41 05/15/2017 0806   CHOLHDL 4.7 05/15/2017 0806   LDLCALC 125 (H) 05/15/2017 0806    PHYSICAL EXAM:    VS:  BP (!) 152/96 (BP Location: Left Arm, Patient Position: Sitting)   Pulse 95   Ht 5\' 8"  (1.727 m)   Wt 162 lb 3.2 oz (73.6 kg)   SpO2 96%   BMI 24.66 kg/m   BMI: Body mass index is 24.66 kg/m.  Physical Exam Vitals reviewed.  Constitutional:      Appearance: He is well-developed.  HENT:     Head: Normocephalic and atraumatic.  Eyes:     General:        Right eye: No discharge.        Left eye: No discharge.  Neck:     Vascular: No JVD.  Cardiovascular:      Rate and Rhythm: Normal rate and regular rhythm.     Heart sounds: Normal heart sounds, S1 normal and S2 normal. Heart sounds not distant. No midsystolic click and no opening snap. No murmur heard.    No friction rub.  Pulmonary:     Effort: Pulmonary effort is normal. No respiratory distress.     Breath sounds: Normal breath sounds. No decreased breath sounds, wheezing, rhonchi or rales.  Chest:     Chest wall: No tenderness.  Musculoskeletal:     Cervical back: Normal range of motion.  Skin:    General: Skin is warm and dry.     Nails: There is no clubbing.  Neurological:     Mental Status: He is alert and oriented to person, place, and time.  Psychiatric:        Speech: Speech normal.        Behavior: Behavior normal.        Thought Content: Thought content normal.        Judgment: Judgment normal.     Wt Readings from Last 3 Encounters:  05/23/23 162 lb 3.2 oz (73.6 kg)  05/04/23 158 lb 12.8 oz (72 kg)  02/28/23 159 lb (72.1 kg)     ASSESSMENT & PLAN:   CAD involving the native coronary arteries with exertional dyspnea concerning for anginal equivalent with abnormal coronary CTA: Currently without symptoms of angina or cardiac decompensation.  In the setting of progressive exertional dyspnea concerning for anginal equivalent, and with abnormal coronary CTA including ctFFR significant lesion involving the proximal LAD we will plan for diagnostic LHC.  Keep his scheduled echo for next week.  Continue newly started aspirin 81 mg and atorvastatin 20 mg daily.  Aortic atherosclerosis/HLD: LDL 125 in 2019.  Now on atorvastatin 20 mg.  Check CMP, lipid panel, and direct LDL.  Moving forward, titrate lipid-lowering therapy as indicated to achieve target LDL less than 70.  HTN: Blood pressure remains elevated in the office today at 152/96, possibly in the setting of increased stress surrounding the above.  Notes blood pressure previously well-controlled with lifestyle modification.  If blood pressure remains elevated and follow-up anticipate escalation of antihypertensive therapy.  Vape/alcohol use: Complete cessation recommended.   Informed Consent   Shared Decision Making/Informed Consent{  The risks [stroke (1 in 1000), death (1 in 1000), kidney failure [usually temporary] (1 in 500), bleeding (1 in 200), allergic reaction [possibly serious] (1 in 200)], benefits (diagnostic support and management of coronary artery disease) and alternatives of a cardiac catheterization were discussed in detail with Mr. Capo and he is willing to proceed.        Disposition: F/u with Dr. Azucena Cecil or an APP 1 to 2 weeks following cardiac cath.   Medication Adjustments/Labs and Tests Ordered: Current medicines are reviewed at length with the patient today.  Concerns regarding medicines are outlined above. Medication changes, Labs and Tests ordered today are summarized above and listed in the Patient Instructions accessible in Encounters.   Signed, Eula Listen, PA-C 05/23/2023 3:38 PM     [facility] [address] [place], [place] [address] [phone]

## 2023-05-22 NOTE — Progress Notes (Unsigned)
 Cardiology Office Note    Date:  05/23/2023   ID:  Kyle Clark, DOB 17-Feb-1971, MRN [id number]  PCP:  [facility]  Cardiologist:  Debbe Odea, MD  Electrophysiologist:  None   Chief Complaint: Follow-up  History of Present Illness:   Kyle Clark is a 53 y.o. male with history of CAD, aortic atherosclerosis, COPD, asthma, prior tobacco use quitting in 2021, HTN, and HLD who presents for follow-up of abnormal coronary CTA.  Prior CT, performed for lung cancer screening, in 12/2022 showed coronary artery calcifications involving the LAD and LCx.  He was seen as a new patient by Dr. Azucena Cecil on 05/04/2023 noting an approximate 2-year history of progressive exertional dyspnea that has been more noticeable as of late.  At that time, he also reported having previously been on medication for hypertension, though BP normalized and he was monitored off pharmacotherapy.  Blood pressure was elevated in the office at 144/100.  He was started on aspirin 81 mg and atorvastatin 20 mg.  Echo and coronary CTA were recommended.  Coronary CTA on 05/17/2023 showed a calcium score of 949, which was the 99th percentile.  There was greater than 70% proximal LAD stenosis with 25 to 49% proximal LCx and RCA stenoses.  ctFFR of 0.78 in the proximal to mid LAD with normal FFRct of the LCx and RCA.  Echo pending at this time.   He comes in accompanied by his wife today and continues to note exertional dyspnea as well as insomnia exertional chest discomfort and at times and intermittent sharp pinpoint chest discomfort that occurs randomly.  He will become short of breath with activities such as taking the trash cans out to the road.  With this, there is some associated fatigue as well.  He also feels like his abdomen is bloated at times.  He did quit smoking in 2021, though currently vapes.  He will drink a 12 pack of beer on a weekend night with friends and at times will drink 3-4 beers per  night during the week, though not every night.  Adherent and tolerating recently initiated aspirin and atorvastatin.   Labs independently reviewed: 04/2023 - BUN 14, serum creatinine 0.93, potassium 4.6 11/2020 - Hgb 15.9, PLT 279 05/2017 - TC 193, TG 134, HDL 41, LDL 125, albumin 4.8, AST/ALT normal, TSH normal  Past Medical History:  Diagnosis Date   Allergic rhinitis    CAD (coronary artery disease)    Hyperlipidemia    Hypertension     Past Surgical History:  Procedure Laterality Date   ELBOW SURGERY     01/2021   ESOPHAGOGASTRODUODENOSCOPY N/A 11/17/2020   Procedure: ESOPHAGOGASTRODUODENOSCOPY (EGD) with possible intervention;  Surgeon: Jaynie Collins, DO;  Location: [facility] ENDOSCOPY;  Service: Gastroenterology;  Laterality: N/A;  food impaction    Current Medications: Current Meds  Medication Sig   albuterol (VENTOLIN HFA) 108 (90 Base) MCG/ACT inhaler INHALE 1-2 PUFFS BY MOUTH EVERY 6 HOURS AS NEEDED FOR WHEEZE OR SHORTNESS OF BREATH   aspirin EC 81 MG tablet Take 1 tablet (81 mg total) by mouth daily. Swallow whole.   atorvastatin (LIPITOR) 20 MG tablet Take 1 tablet (20 mg total) by mouth daily.   Azelastine HCl 0.15 % SOLN PLACE 1 SPRAY INTO THE NOSE IN THE MORNING AND AT BEDTIME.   EPINEPHrine 0.3 mg/0.3 mL IJ SOAJ injection Inject 0.3 mg into the muscle as needed.   fluticasone (FLONASE) 50 MCG/ACT nasal spray Place 1 spray into  both nostrils as needed.   Fluticasone-Umeclidin-Vilant (TRELEGY ELLIPTA) 200-62.5-25 MCG/ACT AEPB Inhale 1 puff into the lungs daily.   levocetirizine (XYZAL) 5 MG tablet TAKE 1 TABLET BY MOUTH EVERY DAY IN THE EVENING   montelukast (SINGULAIR) 10 MG tablet TAKE 1 TABLET BY MOUTH EVERYDAY AT BEDTIME   pantoprazole (PROTONIX) 40 MG tablet Take 1 tablet (40 mg total) by mouth 2 (two) times daily.    Allergies:   Lisinopril   Social History   Socioeconomic History   Marital status: Married    Spouse name: Not on file   Number of  children: Not on file   Years of education: Not on file   Highest education level: Not on file  Occupational History   Not on file  Tobacco Use   Smoking status: Former    Current packs/day: 0.00    Average packs/day: 1.5 packs/day for 31.0 years (46.5 ttl pk-yrs)    Types: Cigarettes    Quit date: 03/14/2019    Years since quitting: 4.1   Smokeless tobacco: Never  Vaping Use   Vaping status: Every Day  Substance and Sexual Activity   Alcohol use: Yes    Comment: occasional alcohol use, drinks beer on weekends maybe once a week   Drug use: No   Sexual activity: Not on file  Other Topics Concern   Not on file  Social History Narrative   Not on file   Social Drivers of Health   Financial Resource Strain: Not on file  Food Insecurity: Not on file  Transportation Needs: Not on file  Physical Activity: Not on file  Stress: Not on file  Social Connections: Not on file     Family History:  The patient's family history includes CAD in his mother; Congestive Heart Failure in his father; Healthy in his daughter and sister.  ROS:   12-point review of systems is negative unless otherwise noted in the HPI.   EKGs/Labs/Other Studies Reviewed:    Studies reviewed were summarized above. The additional studies were reviewed today:  Coronary CTA 05/17/2023: FINDINGS: Aorta:  Normal size.  No calcifications.  No dissection.   Aortic Valve:  Trileaflet.  No calcifications.   Coronary Arteries:  Normal coronary origin.  Right dominance.   RCA is a dominant artery. There is calcified plaque proximally causing mild stenosis (25-49%).   Left main gives rise to LAD and LCX arteries. LM has no disease.   LAD has calcified plaque proximally causing severe stenosis (70%).   LCX is a non-dominant artery. There is calcified plaque proximally causing mild stenosis (25-49%).   Other findings:   Normal pulmonary vein drainage into the left atrium.   Normal left atrial appendage without  a thrombus.   Normal size of the pulmonary artery.   IMPRESSION: 1. Coronary calcium score of 949. This was 99th percentile for age and sex matched control.   2. Normal coronary origin with right dominance.   3. Severe proximal LAD stenosis (>70%).   4. Mild proximal RCA and LCx stenosis (25-49%).   5. CAD-RADS 4 Severe stenosis. (70-99% or > 50% left main). Cardiac catheterization is recommended. Consider symptom-guided anti-ischemic pharmacotherapy as well as risk factor modification per guideline directed care.   6. Additional analysis with CT FFR will be submitted and reported separately.   ctFFR: 1. Left Main:  No significant stenosis. 2. LAD: significant stenosis in proximal-mid segment.  FFRct 0.78 3. LCX: No significant stenosis.  FFRct 0.96 4. RCA: No significant stenosis.  FFRct 0.90   IMPRESSION: 1. CT FFR analysis showed significant stenosis in the proximal-mid LAD. FFRct 0.78 2.  Recommend cardiac catheterization.    EKG:  EKG is ordered today.  The EKG ordered today demonstrates sinus rhythm with sinus arrhythmia with multiform PVCs, 84 bpm, incomplete RBBB, consistent with prior tracing  Recent Labs: 05/04/2023: BUN 14; Creatinine, Ser 0.93; Potassium 4.6; Sodium 141  Recent Lipid Panel    Component Value Date/Time   CHOL 193 05/15/2017 0806   TRIG 134 05/15/2017 0806   HDL 41 05/15/2017 0806   CHOLHDL 4.7 05/15/2017 0806   LDLCALC 125 (H) 05/15/2017 0806    PHYSICAL EXAM:    VS:  BP (!) 152/96 (BP Location: Left Arm, Patient Position: Sitting)   Pulse 95   Ht 5\' 8"  (1.727 m)   Wt 162 lb 3.2 oz (73.6 kg)   SpO2 96%   BMI 24.66 kg/m   BMI: Body mass index is 24.66 kg/m.  Physical Exam Vitals reviewed.  Constitutional:      Appearance: He is well-developed.  HENT:     Head: Normocephalic and atraumatic.  Eyes:     General:        Right eye: No discharge.        Left eye: No discharge.  Neck:     Vascular: No JVD.  Cardiovascular:      Rate and Rhythm: Normal rate and regular rhythm.     Heart sounds: Normal heart sounds, S1 normal and S2 normal. Heart sounds not distant. No midsystolic click and no opening snap. No murmur heard.    No friction rub.  Pulmonary:     Effort: Pulmonary effort is normal. No respiratory distress.     Breath sounds: Normal breath sounds. No decreased breath sounds, wheezing, rhonchi or rales.  Chest:     Chest wall: No tenderness.  Musculoskeletal:     Cervical back: Normal range of motion.  Skin:    General: Skin is warm and dry.     Nails: There is no clubbing.  Neurological:     Mental Status: He is alert and oriented to person, place, and time.  Psychiatric:        Speech: Speech normal.        Behavior: Behavior normal.        Thought Content: Thought content normal.        Judgment: Judgment normal.     Wt Readings from Last 3 Encounters:  05/23/23 162 lb 3.2 oz (73.6 kg)  05/04/23 158 lb 12.8 oz (72 kg)  02/28/23 159 lb (72.1 kg)     ASSESSMENT & PLAN:   CAD involving the native coronary arteries with exertional dyspnea concerning for anginal equivalent with abnormal coronary CTA: Currently without symptoms of angina or cardiac decompensation.  In the setting of progressive exertional dyspnea concerning for anginal equivalent, and with abnormal coronary CTA including ctFFR significant lesion involving the proximal LAD we will plan for diagnostic LHC.  Keep his scheduled echo for next week.  Continue newly started aspirin 81 mg and atorvastatin 20 mg daily.  Aortic atherosclerosis/HLD: LDL 125 in 2019.  Now on atorvastatin 20 mg.  Check CMP, lipid panel, and direct LDL.  Moving forward, titrate lipid-lowering therapy as indicated to achieve target LDL less than 70.  HTN: Blood pressure remains elevated in the office today at 152/96, possibly in the setting of increased stress surrounding the above.  Notes blood pressure previously well-controlled with lifestyle modification.  If blood pressure remains elevated and follow-up anticipate escalation of antihypertensive therapy.  Vape/alcohol use: Complete cessation recommended.   Informed Consent   Shared Decision Making/Informed Consent{  The risks [stroke (1 in 1000), death (1 in 1000), kidney failure [usually temporary] (1 in 500), bleeding (1 in 200), allergic reaction [possibly serious] (1 in 200)], benefits (diagnostic support and management of coronary artery disease) and alternatives of a cardiac catheterization were discussed in detail with Mr. Capo and he is willing to proceed.        Disposition: F/u with Dr. Azucena Cecil or an APP 1 to 2 weeks following cardiac cath.   Medication Adjustments/Labs and Tests Ordered: Current medicines are reviewed at length with the patient today.  Concerns regarding medicines are outlined above. Medication changes, Labs and Tests ordered today are summarized above and listed in the Patient Instructions accessible in Encounters.   Signed, Eula Listen, PA-C 05/23/2023 3:38 PM     [facility] [place] [address] [place], [place] [address] [phone]

## 2023-05-22 NOTE — H&P (View-Only) (Signed)
 Cardiology Office Note    Date:  05/23/2023   ID:  Kyle Clark, DOB 17-Feb-1971, MRN [id number]  PCP:  [facility]  Cardiologist:  Debbe Odea, MD  Electrophysiologist:  None   Chief Complaint: Follow-up  History of Present Illness:   Kyle Clark is a 53 y.o. male with history of CAD, aortic atherosclerosis, COPD, asthma, prior tobacco use quitting in 2021, HTN, and HLD who presents for follow-up of abnormal coronary CTA.  Prior CT, performed for lung cancer screening, in 12/2022 showed coronary artery calcifications involving the LAD and LCx.  He was seen as a new patient by Dr. Azucena Cecil on 05/04/2023 noting an approximate 2-year history of progressive exertional dyspnea that has been more noticeable as of late.  At that time, he also reported having previously been on medication for hypertension, though BP normalized and he was monitored off pharmacotherapy.  Blood pressure was elevated in the office at 144/100.  He was started on aspirin 81 mg and atorvastatin 20 mg.  Echo and coronary CTA were recommended.  Coronary CTA on 05/17/2023 showed a calcium score of 949, which was the 99th percentile.  There was greater than 70% proximal LAD stenosis with 25 to 49% proximal LCx and RCA stenoses.  ctFFR of 0.78 in the proximal to mid LAD with normal FFRct of the LCx and RCA.  Echo pending at this time.   He comes in accompanied by his wife today and continues to note exertional dyspnea as well as insomnia exertional chest discomfort and at times and intermittent sharp pinpoint chest discomfort that occurs randomly.  He will become short of breath with activities such as taking the trash cans out to the road.  With this, there is some associated fatigue as well.  He also feels like his abdomen is bloated at times.  He did quit smoking in 2021, though currently vapes.  He will drink a 12 pack of beer on a weekend night with friends and at times will drink 3-4 beers per  night during the week, though not every night.  Adherent and tolerating recently initiated aspirin and atorvastatin.   Labs independently reviewed: 04/2023 - BUN 14, serum creatinine 0.93, potassium 4.6 11/2020 - Hgb 15.9, PLT 279 05/2017 - TC 193, TG 134, HDL 41, LDL 125, albumin 4.8, AST/ALT normal, TSH normal  Past Medical History:  Diagnosis Date   Allergic rhinitis    CAD (coronary artery disease)    Hyperlipidemia    Hypertension     Past Surgical History:  Procedure Laterality Date   ELBOW SURGERY     01/2021   ESOPHAGOGASTRODUODENOSCOPY N/A 11/17/2020   Procedure: ESOPHAGOGASTRODUODENOSCOPY (EGD) with possible intervention;  Surgeon: Jaynie Collins, DO;  Location: [facility] ENDOSCOPY;  Service: Gastroenterology;  Laterality: N/A;  food impaction    Current Medications: Current Meds  Medication Sig   albuterol (VENTOLIN HFA) 108 (90 Base) MCG/ACT inhaler INHALE 1-2 PUFFS BY MOUTH EVERY 6 HOURS AS NEEDED FOR WHEEZE OR SHORTNESS OF BREATH   aspirin EC 81 MG tablet Take 1 tablet (81 mg total) by mouth daily. Swallow whole.   atorvastatin (LIPITOR) 20 MG tablet Take 1 tablet (20 mg total) by mouth daily.   Azelastine HCl 0.15 % SOLN PLACE 1 SPRAY INTO THE NOSE IN THE MORNING AND AT BEDTIME.   EPINEPHrine 0.3 mg/0.3 mL IJ SOAJ injection Inject 0.3 mg into the muscle as needed.   fluticasone (FLONASE) 50 MCG/ACT nasal spray Place 1 spray into  both nostrils as needed.   Fluticasone-Umeclidin-Vilant (TRELEGY ELLIPTA) 200-62.5-25 MCG/ACT AEPB Inhale 1 puff into the lungs daily.   levocetirizine (XYZAL) 5 MG tablet TAKE 1 TABLET BY MOUTH EVERY DAY IN THE EVENING   montelukast (SINGULAIR) 10 MG tablet TAKE 1 TABLET BY MOUTH EVERYDAY AT BEDTIME   pantoprazole (PROTONIX) 40 MG tablet Take 1 tablet (40 mg total) by mouth 2 (two) times daily.    Allergies:   Lisinopril   Social History   Socioeconomic History   Marital status: Married    Spouse name: Not on file   Number of  children: Not on file   Years of education: Not on file   Highest education level: Not on file  Occupational History   Not on file  Tobacco Use   Smoking status: Former    Current packs/day: 0.00    Average packs/day: 1.5 packs/day for 31.0 years (46.5 ttl pk-yrs)    Types: Cigarettes    Quit date: 03/14/2019    Years since quitting: 4.1   Smokeless tobacco: Never  Vaping Use   Vaping status: Every Day  Substance and Sexual Activity   Alcohol use: Yes    Comment: occasional alcohol use, drinks beer on weekends maybe once a week   Drug use: No   Sexual activity: Not on file  Other Topics Concern   Not on file  Social History Narrative   Not on file   Social Drivers of Health   Financial Resource Strain: Not on file  Food Insecurity: Not on file  Transportation Needs: Not on file  Physical Activity: Not on file  Stress: Not on file  Social Connections: Not on file     Family History:  The patient's family history includes CAD in his mother; Congestive Heart Failure in his father; Healthy in his daughter and sister.  ROS:   12-point review of systems is negative unless otherwise noted in the HPI.   EKGs/Labs/Other Studies Reviewed:    Studies reviewed were summarized above. The additional studies were reviewed today:  Coronary CTA 05/17/2023: FINDINGS: Aorta:  Normal size.  No calcifications.  No dissection.   Aortic Valve:  Trileaflet.  No calcifications.   Coronary Arteries:  Normal coronary origin.  Right dominance.   RCA is a dominant artery. There is calcified plaque proximally causing mild stenosis (25-49%).   Left main gives rise to LAD and LCX arteries. LM has no disease.   LAD has calcified plaque proximally causing severe stenosis (70%).   LCX is a non-dominant artery. There is calcified plaque proximally causing mild stenosis (25-49%).   Other findings:   Normal pulmonary vein drainage into the left atrium.   Normal left atrial appendage without  a thrombus.   Normal size of the pulmonary artery.   IMPRESSION: 1. Coronary calcium score of 949. This was 99th percentile for age and sex matched control.   2. Normal coronary origin with right dominance.   3. Severe proximal LAD stenosis (>70%).   4. Mild proximal RCA and LCx stenosis (25-49%).   5. CAD-RADS 4 Severe stenosis. (70-99% or > 50% left main). Cardiac catheterization is recommended. Consider symptom-guided anti-ischemic pharmacotherapy as well as risk factor modification per guideline directed care.   6. Additional analysis with CT FFR will be submitted and reported separately.   ctFFR: 1. Left Main:  No significant stenosis. 2. LAD: significant stenosis in proximal-mid segment.  FFRct 0.78 3. LCX: No significant stenosis.  FFRct 0.96 4. RCA: No significant stenosis.  FFRct 0.90   IMPRESSION: 1. CT FFR analysis showed significant stenosis in the proximal-mid LAD. FFRct 0.78 2.  Recommend cardiac catheterization.    EKG:  EKG is ordered today.  The EKG ordered today demonstrates sinus rhythm with sinus arrhythmia with multiform PVCs, 84 bpm, incomplete RBBB, consistent with prior tracing  Recent Labs: 05/04/2023: BUN 14; Creatinine, Ser 0.93; Potassium 4.6; Sodium 141  Recent Lipid Panel    Component Value Date/Time   CHOL 193 05/15/2017 0806   TRIG 134 05/15/2017 0806   HDL 41 05/15/2017 0806   CHOLHDL 4.7 05/15/2017 0806   LDLCALC 125 (H) 05/15/2017 0806    PHYSICAL EXAM:    VS:  BP (!) 152/96 (BP Location: Left Arm, Patient Position: Sitting)   Pulse 95   Ht 5\' 8"  (1.727 m)   Wt 162 lb 3.2 oz (73.6 kg)   SpO2 96%   BMI 24.66 kg/m   BMI: Body mass index is 24.66 kg/m.  Physical Exam Vitals reviewed.  Constitutional:      Appearance: He is well-developed.  HENT:     Head: Normocephalic and atraumatic.  Eyes:     General:        Right eye: No discharge.        Left eye: No discharge.  Neck:     Vascular: No JVD.  Cardiovascular:      Rate and Rhythm: Normal rate and regular rhythm.     Heart sounds: Normal heart sounds, S1 normal and S2 normal. Heart sounds not distant. No midsystolic click and no opening snap. No murmur heard.    No friction rub.  Pulmonary:     Effort: Pulmonary effort is normal. No respiratory distress.     Breath sounds: Normal breath sounds. No decreased breath sounds, wheezing, rhonchi or rales.  Chest:     Chest wall: No tenderness.  Musculoskeletal:     Cervical back: Normal range of motion.  Skin:    General: Skin is warm and dry.     Nails: There is no clubbing.  Neurological:     Mental Status: He is alert and oriented to person, place, and time.  Psychiatric:        Speech: Speech normal.        Behavior: Behavior normal.        Thought Content: Thought content normal.        Judgment: Judgment normal.     Wt Readings from Last 3 Encounters:  05/23/23 162 lb 3.2 oz (73.6 kg)  05/04/23 158 lb 12.8 oz (72 kg)  02/28/23 159 lb (72.1 kg)     ASSESSMENT & PLAN:   CAD involving the native coronary arteries with exertional dyspnea concerning for anginal equivalent with abnormal coronary CTA: Currently without symptoms of angina or cardiac decompensation.  In the setting of progressive exertional dyspnea concerning for anginal equivalent, and with abnormal coronary CTA including ctFFR significant lesion involving the proximal LAD we will plan for diagnostic LHC.  Keep his scheduled echo for next week.  Continue newly started aspirin 81 mg and atorvastatin 20 mg daily.  Aortic atherosclerosis/HLD: LDL 125 in 2019.  Now on atorvastatin 20 mg.  Check CMP, lipid panel, and direct LDL.  Moving forward, titrate lipid-lowering therapy as indicated to achieve target LDL less than 70.  HTN: Blood pressure remains elevated in the office today at 152/96, possibly in the setting of increased stress surrounding the above.  Notes blood pressure previously well-controlled with lifestyle modification.  If blood pressure remains elevated and follow-up anticipate escalation of antihypertensive therapy.  Vape/alcohol use: Complete cessation recommended.   Informed Consent   Shared Decision Making/Informed Consent{  The risks [stroke (1 in 1000), death (1 in 1000), kidney failure [usually temporary] (1 in 500), bleeding (1 in 200), allergic reaction [possibly serious] (1 in 200)], benefits (diagnostic support and management of coronary artery disease) and alternatives of a cardiac catheterization were discussed in detail with Mr. Capo and he is willing to proceed.        Disposition: F/u with Dr. Azucena Cecil or an APP 1 to 2 weeks following cardiac cath.   Medication Adjustments/Labs and Tests Ordered: Current medicines are reviewed at length with the patient today.  Concerns regarding medicines are outlined above. Medication changes, Labs and Tests ordered today are summarized above and listed in the Patient Instructions accessible in Encounters.   Signed, Eula Listen, PA-C 05/23/2023 3:38 PM     [facility] [place] [address] [place], [place] [address] [phone]

## 2023-05-23 ENCOUNTER — Encounter: Payer: Self-pay | Admitting: Physician Assistant

## 2023-05-23 ENCOUNTER — Ambulatory Visit: Attending: Physician Assistant | Admitting: Physician Assistant

## 2023-05-23 VITALS — BP 152/96 | HR 95 | Ht 68.0 in | Wt 162.2 lb

## 2023-05-23 DIAGNOSIS — E785 Hyperlipidemia, unspecified: Secondary | ICD-10-CM

## 2023-05-23 DIAGNOSIS — R0609 Other forms of dyspnea: Secondary | ICD-10-CM | POA: Diagnosis not present

## 2023-05-23 DIAGNOSIS — I10 Essential (primary) hypertension: Secondary | ICD-10-CM

## 2023-05-23 DIAGNOSIS — I700 Atherosclerosis of aorta: Secondary | ICD-10-CM

## 2023-05-23 DIAGNOSIS — I2089 Other forms of angina pectoris: Secondary | ICD-10-CM

## 2023-05-23 DIAGNOSIS — I25118 Atherosclerotic heart disease of native coronary artery with other forms of angina pectoris: Secondary | ICD-10-CM | POA: Diagnosis not present

## 2023-05-23 DIAGNOSIS — Z79899 Other long term (current) drug therapy: Secondary | ICD-10-CM

## 2023-05-23 DIAGNOSIS — R931 Abnormal findings on diagnostic imaging of heart and coronary circulation: Secondary | ICD-10-CM

## 2023-05-23 NOTE — Patient Instructions (Signed)
 Medication Instructions:   Your Physician recommend you continue on your current medication as directed.     *If you need a refill on your cardiac medications before your next appointment, please call your pharmacy*   Lab Work: Your provider would like for you to have following labs drawn today CMET, CBC Lipid and direct LDL.   If you have labs (blood work) drawn today and your tests are completely normal, you will receive your results only by: MyChart Message (if you have MyChart) OR A paper copy in the mail If you have any lab test that is abnormal or we need to change your treatment, we will call you to review the results.   Testing/Procedures:  [place] [facility] DEPT OF [facility] AT [place] [address] [place] [address] Dept: [phone] Loc: [phone]  Kyle Clark  05/23/2023  You are scheduled for a Cardiac Catheterization on Friday, March 21 with Dr. Tonny Bollman.  1. Please arrive at the [facility] (Main Entrance A) at [facility]: [address] [place], [place] [address] at 5:30 AM (This time is 2 hour(s) before your procedure to ensure your preparation).   Free valet parking service is available. You will check in at ADMITTING. The support person will be asked to wait in the waiting room.  It is OK to have someone drop you off and come back when you are ready to be discharged.    Special note: Every effort is made to have your procedure done on time. Please understand that emergencies sometimes delay scheduled procedures.  2. Diet: Do not eat solid foods after midnight.  The patient may have clear liquids until 5am upon the day of the procedure.  4. Medication instructions in preparation for your procedure:   Contrast Allergy: No  On the morning of your procedure, take your Aspirin 81 mg and any morning medicines NOT listed above.  You may use sips of water.  5.  Plan to go home the same day, you will only stay overnight if medically necessary. 6. Bring a current list of your medications and current insurance cards. 7. You MUST have a responsible person to drive you home. 8. Someone MUST be with you the first 24 hours after you arrive home or your discharge will be delayed. 9. Please wear clothes that are easy to get on and off and wear slip-on shoes.  Thank you for allowing [place] to care for you!   -- [place] [facility]    Follow-Up: At [facility], you and your health needs are our priority.  As part of our continuing mission to provide you with exceptional heart care, we have created designated Provider Care Teams.  These Care Teams include your primary Cardiologist (physician) and Advanced Practice Providers (APPs -  Physician Assistants and Nurse Practitioners) who all work together to provide you with the care you need, when you need it.  We recommend signing up for the patient portal called "MyChart".  Sign up information is provided on this After Visit Summary.  MyChart is used to connect with patients for Virtual Visits (Telemedicine).  Patients are able to view lab/test results, encounter notes, upcoming appointments, etc.  Non-urgent messages can be sent to your provider as well.   To learn more about what you can do with MyChart, go to [email or web address].    Your next appointment:   3 week(s)  Provider:  You may see Debbe Odea, MD or one of the following Advanced Practice Providers on your designated Care Team:    Eula Listen, [place]

## 2023-05-24 LAB — LIPID PANEL
Chol/HDL Ratio: 3.4 ratio (ref 0.0–5.0)
Cholesterol, Total: 172 mg/dL (ref 100–199)
HDL: 51 mg/dL (ref >39)
LDL Chol Calc (NIH): 85 mg/dL (ref 0–99)
Triglycerides: 215 mg/dL — ABNORMAL HIGH (ref 0–149)
VLDL Cholesterol Cal: 36 mg/dL (ref 5–40)

## 2023-05-24 LAB — COMPREHENSIVE METABOLIC PANEL
ALT: 53 IU/L — ABNORMAL HIGH (ref 0–44)
AST: 31 IU/L (ref 0–40)
Albumin: 4.7 g/dL (ref 3.8–4.9)
Alkaline Phosphatase: 113 IU/L (ref 44–121)
BUN/Creatinine Ratio: 14 (ref 9–20)
BUN: 13 mg/dL (ref 6–24)
Bilirubin Total: 0.4 mg/dL (ref 0.0–1.2)
CO2: 20 mmol/L (ref 20–29)
Calcium: 10.2 mg/dL (ref 8.7–10.2)
Chloride: 104 mmol/L (ref 96–106)
Creatinine, Ser: 0.93 mg/dL (ref 0.76–1.27)
Globulin, Total: 2.5 g/dL (ref 1.5–4.5)
Glucose: 89 mg/dL (ref 70–99)
Potassium: 5.1 mmol/L (ref 3.5–5.2)
Sodium: 142 mmol/L (ref 134–144)
Total Protein: 7.2 g/dL (ref 6.0–8.5)
eGFR: 99 mL/min/{1.73_m2} (ref >59)

## 2023-05-24 LAB — CBC
Hematocrit: 49.3 % (ref 37.5–51.0)
Hemoglobin: 17 g/dL (ref 13.0–17.7)
MCH: 30.6 pg (ref 26.6–33.0)
MCHC: 34.5 g/dL (ref 31.5–35.7)
MCV: 89 fL (ref 79–97)
Platelets: 296 10*3/uL (ref 150–450)
RBC: 5.56 x10E6/uL (ref 4.14–5.80)
RDW: 12.1 % (ref 11.6–15.4)
WBC: 8.7 10*3/uL (ref 3.4–10.8)

## 2023-05-24 LAB — LDL CHOLESTEROL, DIRECT: LDL Direct: 90 mg/dL (ref 0–99)

## 2023-05-25 ENCOUNTER — Other Ambulatory Visit: Payer: Self-pay | Admitting: Emergency Medicine

## 2023-05-25 DIAGNOSIS — E7849 Other hyperlipidemia: Secondary | ICD-10-CM

## 2023-05-25 MED ORDER — ATORVASTATIN CALCIUM 40 MG PO TABS: 40.0000 mg | ORAL_TABLET | Freq: Every day | ORAL | 3 refills | Status: DC

## 2023-05-28 ENCOUNTER — Telehealth: Payer: Self-pay | Admitting: Cardiology

## 2023-05-28 DIAGNOSIS — Z0279 Encounter for issue of other medical certificate: Secondary | ICD-10-CM

## 2023-05-28 NOTE — Telephone Encounter (Signed)
 Patient filled out FMLA paperwork & paid $29, placed forms in nurse box.

## 2023-05-29 NOTE — Telephone Encounter (Signed)
 Await LHC results.

## 2023-05-30 ENCOUNTER — Ambulatory Visit: Payer: 59 | Attending: Cardiology

## 2023-05-30 ENCOUNTER — Encounter: Payer: Self-pay | Admitting: Cardiology

## 2023-05-30 DIAGNOSIS — R0600 Dyspnea, unspecified: Secondary | ICD-10-CM

## 2023-05-30 DIAGNOSIS — I2510 Atherosclerotic heart disease of native coronary artery without angina pectoris: Secondary | ICD-10-CM

## 2023-05-30 LAB — ECHOCARDIOGRAM COMPLETE
AV Mean grad: 3 mmHg
AV Peak grad: 4.9 mmHg
Ao pk vel: 1.11 m/s
Area-P 1/2: 4.39 cm2
S' Lateral: 3.6 cm

## 2023-05-31 ENCOUNTER — Telehealth: Payer: Self-pay | Admitting: *Deleted

## 2023-05-31 NOTE — Telephone Encounter (Signed)
 Cardiac Catheterization scheduled at [facility] for: Friday June 01, 2023 7:30 AM Arrival time [facility] Main Entrance A at: 5:30 AM  Nothing to eat after midnight prior to procedure, clear liquids until 5 AM day of procedure.  Medication instructions: -Usual morning medications can be taken with sips of water including aspirin 81 mg.  Plan to go home the same day, you will only stay overnight if medically necessary.  You must have responsible adult to drive you home.  Someone must be with you the first 24 hours after you arrive home.   Reviewed procedure instructions with patient.

## 2023-06-01 ENCOUNTER — Ambulatory Visit (HOSPITAL_COMMUNITY)
Admission: RE | Admit: 2023-06-01 | Discharge: 2023-06-01 | Disposition: A | Discharge status: Home / Self Care | Attending: Cardiovascular Disease | Admitting: Cardiovascular Disease

## 2023-06-01 ENCOUNTER — Encounter (HOSPITAL_COMMUNITY): Payer: Self-pay | Admitting: Cardiovascular Disease

## 2023-06-01 ENCOUNTER — Encounter (HOSPITAL_COMMUNITY)
Admission: RE | Disposition: A | Discharge status: Home / Self Care | Payer: Self-pay | Source: Home / Self Care | Attending: Cardiovascular Disease

## 2023-06-01 ENCOUNTER — Other Ambulatory Visit: Payer: Self-pay

## 2023-06-01 DIAGNOSIS — F17290 Nicotine dependence, other tobacco product, uncomplicated: Secondary | ICD-10-CM | POA: Insufficient documentation

## 2023-06-01 DIAGNOSIS — I2510 Atherosclerotic heart disease of native coronary artery without angina pectoris: Secondary | ICD-10-CM | POA: Insufficient documentation

## 2023-06-01 DIAGNOSIS — I700 Atherosclerosis of aorta: Secondary | ICD-10-CM | POA: Diagnosis not present

## 2023-06-01 DIAGNOSIS — Z8249 Family history of ischemic heart disease and other diseases of the circulatory system: Secondary | ICD-10-CM | POA: Insufficient documentation

## 2023-06-01 DIAGNOSIS — Z7982 Long term (current) use of aspirin: Secondary | ICD-10-CM | POA: Insufficient documentation

## 2023-06-01 DIAGNOSIS — I2582 Chronic total occlusion of coronary artery: Secondary | ICD-10-CM | POA: Insufficient documentation

## 2023-06-01 DIAGNOSIS — I10 Essential (primary) hypertension: Secondary | ICD-10-CM | POA: Diagnosis not present

## 2023-06-01 DIAGNOSIS — R931 Abnormal findings on diagnostic imaging of heart and coronary circulation: Secondary | ICD-10-CM | POA: Insufficient documentation

## 2023-06-01 DIAGNOSIS — Z79899 Other long term (current) drug therapy: Secondary | ICD-10-CM | POA: Diagnosis not present

## 2023-06-01 DIAGNOSIS — E785 Hyperlipidemia, unspecified: Secondary | ICD-10-CM | POA: Insufficient documentation

## 2023-06-01 HISTORY — PX: LEFT HEART CATH AND CORONARY ANGIOGRAPHY: CATH118249

## 2023-06-01 LAB — POCT ACTIVATED CLOTTING TIME
Activated Clotting Time: 262 s
Activated Clotting Time: 268 s
Activated Clotting Time: 291 s
Activated Clotting Time: 297 s

## 2023-06-01 SURGERY — LEFT HEART CATH AND CORONARY ANGIOGRAPHY
Anesthesia: LOCAL

## 2023-06-01 MED ORDER — LIDOCAINE HCL (PF) 1 % IJ SOLN
INTRAMUSCULAR | Status: AC
  Filled 2023-06-01: qty 30

## 2023-06-01 MED ORDER — CLOPIDOGREL BISULFATE 75 MG PO TABS: 75.0000 mg | ORAL_TABLET | Freq: Every day | ORAL | 11 refills | Status: DC

## 2023-06-01 MED ORDER — CLOPIDOGREL BISULFATE 75 MG PO TABS: 75.0000 mg | ORAL_TABLET | Freq: Every day | ORAL | Status: DC

## 2023-06-01 MED ORDER — SODIUM CHLORIDE 0.9 % WEIGHT BASED INFUSION: 3.0000 mL/kg/h | INTRAVENOUS | Status: AC

## 2023-06-01 MED ORDER — HYDRALAZINE HCL 20 MG/ML IJ SOLN: 10.0000 mg | INTRAMUSCULAR | Status: DC | PRN

## 2023-06-01 MED ORDER — LABETALOL HCL 5 MG/ML IV SOLN: 10.0000 mg | INTRAVENOUS | Status: DC | PRN

## 2023-06-01 MED ORDER — SODIUM CHLORIDE 0.9 % WEIGHT BASED INFUSION: 1.0000 mL/kg/h | INTRAVENOUS | Status: DC

## 2023-06-01 MED ORDER — VERAPAMIL HCL 2.5 MG/ML IV SOLN
INTRAVENOUS | Status: DC | PRN
  Administered 2023-06-01: 10 mL via INTRA_ARTERIAL

## 2023-06-01 MED ORDER — MIDAZOLAM HCL 2 MG/2ML IJ SOLN
INTRAMUSCULAR | Status: AC
  Filled 2023-06-01: qty 2

## 2023-06-01 MED ORDER — ONDANSETRON HCL 4 MG/2ML IJ SOLN: 4.0000 mg | Freq: Four times a day (QID) | INTRAMUSCULAR | Status: DC | PRN

## 2023-06-01 MED ORDER — SODIUM CHLORIDE 0.9% FLUSH: 3.0000 mL | INTRAVENOUS | Status: DC | PRN

## 2023-06-01 MED ORDER — MIDAZOLAM HCL 2 MG/2ML IJ SOLN
INTRAMUSCULAR | Status: DC | PRN
  Administered 2023-06-01: 2 mg via INTRAVENOUS
  Administered 2023-06-01: 1 mg via INTRAVENOUS
  Administered 2023-06-01: 2 mg via INTRAVENOUS

## 2023-06-01 MED ORDER — SODIUM CHLORIDE 0.9 % IV SOLN: 250.0000 mL | INTRAVENOUS | Status: DC | PRN

## 2023-06-01 MED ORDER — METOPROLOL TARTRATE 25 MG PO TABS: 25.0000 mg | ORAL_TABLET | Freq: Two times a day (BID) | ORAL | 11 refills | Status: DC

## 2023-06-01 MED ORDER — ACETAMINOPHEN 325 MG PO TABS: 650.0000 mg | ORAL_TABLET | ORAL | Status: DC | PRN

## 2023-06-01 MED ORDER — IOHEXOL 350 MG/ML SOLN
INTRAVENOUS | Status: DC | PRN
  Administered 2023-06-01: 120 mL

## 2023-06-01 MED ORDER — FENTANYL CITRATE (PF) 100 MCG/2ML IJ SOLN
INTRAMUSCULAR | Status: DC | PRN
  Administered 2023-06-01 (×2): 25 ug via INTRAVENOUS
  Administered 2023-06-01: 50 ug via INTRAVENOUS

## 2023-06-01 MED ORDER — VERAPAMIL HCL 2.5 MG/ML IV SOLN
INTRAVENOUS | Status: AC
  Filled 2023-06-01: qty 2

## 2023-06-01 MED ORDER — HEPARIN (PORCINE) IN NACL 1000-0.9 UT/500ML-% IV SOLN
INTRAVENOUS | Status: DC | PRN
  Administered 2023-06-01 (×2): 500 mL

## 2023-06-01 MED ORDER — LIDOCAINE HCL (PF) 1 % IJ SOLN
INTRAMUSCULAR | Status: DC | PRN
  Administered 2023-06-01: 2 mL

## 2023-06-01 MED ORDER — SODIUM CHLORIDE 0.9% FLUSH: 3.0000 mL | Freq: Two times a day (BID) | INTRAVENOUS | Status: DC

## 2023-06-01 MED ORDER — HEPARIN SODIUM (PORCINE) 1000 UNIT/ML IJ SOLN
INTRAMUSCULAR | Status: DC | PRN
  Administered 2023-06-01: 4000 [IU] via INTRAVENOUS
  Administered 2023-06-01: 2000 [IU] via INTRAVENOUS
  Administered 2023-06-01: 4000 [IU] via INTRAVENOUS

## 2023-06-01 MED ORDER — ASPIRIN 81 MG PO CHEW: 81.0000 mg | CHEWABLE_TABLET | ORAL | Status: DC

## 2023-06-01 MED ORDER — FENTANYL CITRATE (PF) 100 MCG/2ML IJ SOLN
INTRAMUSCULAR | Status: AC
  Filled 2023-06-01: qty 2

## 2023-06-01 MED ORDER — HEPARIN SODIUM (PORCINE) 1000 UNIT/ML IJ SOLN
INTRAMUSCULAR | Status: AC
Start: 2023-06-01 — End: ?
  Filled 2023-06-01: qty 10

## 2023-06-01 SURGICAL SUPPLY — 13 items
BALLN SPRINTER MX OTW 1.5X12 (BALLOONS) ×1 IMPLANT
BALLOON SPRINTER MX OTW 1.5X12 (BALLOONS) IMPLANT
CATH 5FR JL3.5 JR4 ANG PIG MP (CATHETERS) IMPLANT
CATH LAUNCHER 6FR EBU3.5 (CATHETERS) IMPLANT
DEVICE RAD COMP TR BAND LRG (VASCULAR PRODUCTS) IMPLANT
GLIDESHEATH SLEND SS 6F .021 (SHEATH) IMPLANT
GUIDEWIRE INQWIRE 1.5J.035X260 (WIRE) IMPLANT
INQWIRE 1.5J .035X260CM (WIRE) ×1 IMPLANT
KIT ENCORE 26 ADVANTAGE (KITS) IMPLANT
PACK CARDIAC CATHETERIZATION (CUSTOM PROCEDURE TRAY) ×1 IMPLANT
TUBING CIL FLEX 10 FLL-RA (TUBING) IMPLANT
WIRE ASAHI MIRACLEBROS-3 300CM (WIRE) IMPLANT
WIRE HI TORQ WHISPER MS 190CM (WIRE) IMPLANT

## 2023-06-01 NOTE — Progress Notes (Signed)
 Patient and wife was given discharge instructions. Both verbalized understanding.

## 2023-06-01 NOTE — Telephone Encounter (Signed)
 Forms given to [facility] for processing.

## 2023-06-01 NOTE — Interval H&P Note (Signed)
 History and Physical Interval Note:  06/01/2023 7:47 AM  Kyle Clark  has presented today for surgery, with the diagnosis of abnormal cta.  The various methods of treatment have been discussed with the patient and family. After consideration of risks, benefits and other options for treatment, the patient has consented to  Procedure(s): LEFT HEART CATH AND CORONARY ANGIOGRAPHY (N/A) as a surgical intervention.  The patient's history has been reviewed, patient examined, no change in status, stable for surgery.  I have reviewed the patient's chart and labs.  Questions were answered to the patient's satisfaction.     Tonny Bollman

## 2023-06-01 NOTE — Progress Notes (Signed)
 Called to Short Stay for TR band repositioning.  TR band placed on right radial using blood pressure cuff. Band adjusted to 8cc with a reverse barbeau of an intermittant c.   Band placed at 09:49:00

## 2023-06-01 NOTE — Progress Notes (Signed)
 Patient had some bleeding and new band applied. 7 cc of air is in the band.

## 2023-06-01 NOTE — Telephone Encounter (Signed)
 FMLA completed and given to clinical staff.

## 2023-06-01 NOTE — Discharge Instructions (Addendum)
 PCI is scheduled for April 3rd at 10:30.  Please arrive at 0830.  Nothing to eat or drink.  Ok to take medications morning of procedure.    Drink plenty of fluid for 48 hours and keep wrist elevated at heart level for 24 hours  Radial Site Care   This sheet gives you information about how to care for yourself after your procedure. Your health care provider may also give you more specific instructions. If you have problems or questions, contact your health care provider. What can I expect after the procedure? After the procedure, it is common to have: Bruising and tenderness at the catheter insertion area. Follow these instructions at home: Medicines Take over-the-counter and prescription medicines only as told by your health care provider. Insertion site care Follow instructions from your health care provider about how to take care of your insertion site. Make sure you: Wash your hands with soap and water before you change your bandage (dressing). If soap and water are not available, use hand sanitizer. remove your dressing as told by your health care provider. In 24 hours Check your insertion site every day for signs of infection. Check for: Redness, swelling, or pain. Fluid or blood. Pus or a bad smell. Warmth. Do not take baths, swim, or use a hot tub until your health care provider approves. You may shower 24-48 hours after the procedure, or as directed by your health care provider. Remove the dressing and gently wash the site with plain soap and water. Pat the area dry with a clean towel. Do not rub the site. That could cause bleeding. Do not apply powder or lotion to the site. Activity   For 24 hours after the procedure, or as directed by your health care provider: Do not flex or bend the affected arm. Do not push or pull heavy objects with the affected arm. Do not drive yourself home from the hospital or clinic. You may drive 24 hours after the procedure unless your health  care provider tells you not to. Do not operate machinery or power tools. Do not lift anything that is heavier than 10 lb (4.5 kg), or the limit that you are told, until your health care provider says that it is safe. For 4 days Ask your health care provider when it is okay to: Return to work or school. Resume usual physical activities or sports. Resume sexual activity. General instructions If the catheter site starts to bleed, raise your arm and put firm pressure on the site. If the bleeding does not stop, get help right away. This is a medical emergency. If you went home on the same day as your procedure, a responsible adult should be with you for the first 24 hours after you arrive home. Keep all follow-up visits as told by your health care provider. This is important. Contact a health care provider if: You have a fever. You have redness, swelling, or yellow drainage around your insertion site. Get help right away if: You have unusual pain at the radial site. The catheter insertion area swells very fast. The insertion area is bleeding, and the bleeding does not stop when you hold steady pressure on the area. Your arm or hand becomes pale, cool, tingly, or numb. These symptoms may represent a serious problem that is an emergency. Do not wait to see if the symptoms will go away. Get medical help right away. Call your local emergency services (911 in the [place] Do not drive yourself to the hospital.  Summary After the procedure, it is common to have bruising and tenderness at the site. Follow instructions from your health care provider about how to take care of your radial site wound. Check the wound every day for signs of infection. Do not lift anything that is heavier than 10 lb (4.5 kg), or the limit that you are told, until your health care provider says that it is safe. This information is not intended to replace advice given to you by your health care provider. Make sure you discuss any  questions you have with your health care provider. Document Revised: 04/04/2017 Document Reviewed: 04/04/2017 Elsevier Patient Education  2020 [employer].

## 2023-06-01 NOTE — Telephone Encounter (Signed)
 Received completed FMLA paperwork from Gregor Hams, RN. Called patient to inform forms were completed. Patient states he will be by the office today to pick up completed forms.

## 2023-06-05 ENCOUNTER — Telehealth: Payer: Self-pay | Admitting: *Deleted

## 2023-06-05 DIAGNOSIS — Z01812 Encounter for preprocedural laboratory examination: Secondary | ICD-10-CM

## 2023-06-05 DIAGNOSIS — I25118 Atherosclerotic heart disease of native coronary artery with other forms of angina pectoris: Secondary | ICD-10-CM

## 2023-06-05 NOTE — Telephone Encounter (Signed)
 Coronary CTO scheduled at [facility] for: Thursday June 14, 2023 10:30 AM Arrival time [facility] Main Entrance A at: 8:30 AM  Nothing to eat after midnight prior to procedure, clear liquids until 5 AM day of procedure.  Medication instructions: -Usual morning medications can be taken with sips of water including aspirin 81 mg and Plavix 75 mg.  Plan to go home the same day, you will only stay overnight if medically necessary.  You must have responsible adult to drive you home.  Someone must be with you the first 24 hours after you arrive home.  Reviewed procedure instructions with patient, will also send instructions in MyChart. Patient plans to go to [facility] tomorrow for pre-procedure BMP/CBC. Staff message sent to prior authorization.

## 2023-06-06 ENCOUNTER — Other Ambulatory Visit
Admission: RE | Admit: 2023-06-06 | Discharge: 2023-06-06 | Disposition: A | Discharge status: Home / Self Care | Attending: Cardiovascular Disease | Admitting: Cardiovascular Disease

## 2023-06-06 DIAGNOSIS — Z01812 Encounter for preprocedural laboratory examination: Secondary | ICD-10-CM | POA: Insufficient documentation

## 2023-06-06 DIAGNOSIS — I25118 Atherosclerotic heart disease of native coronary artery with other forms of angina pectoris: Secondary | ICD-10-CM | POA: Insufficient documentation

## 2023-06-06 LAB — BASIC METABOLIC PANEL
Anion gap: 10 (ref 5–15)
BUN: 13 mg/dL (ref 6–20)
CO2: 23 mmol/L (ref 22–32)
Calcium: 9.2 mg/dL (ref 8.9–10.3)
Chloride: 104 mmol/L (ref 98–111)
Creatinine, Ser: 0.98 mg/dL (ref 0.61–1.24)
GFR, Estimated: >60 mL/min (ref >60)
Glucose, Bld: 99 mg/dL (ref 70–99)
Potassium: 4.1 mmol/L (ref 3.5–5.1)
Sodium: 137 mmol/L (ref 135–145)

## 2023-06-06 LAB — CBC
HCT: 45 % (ref 39.0–52.0)
Hemoglobin: 16 g/dL (ref 13.0–17.0)
MCH: 30.5 pg (ref 26.0–34.0)
MCHC: 35.6 g/dL (ref 30.0–36.0)
MCV: 85.7 fL (ref 80.0–100.0)
Platelets: 291 10*3/uL (ref 150–400)
RBC: 5.25 MIL/uL (ref 4.22–5.81)
RDW: 11.4 % — ABNORMAL LOW (ref 11.5–15.5)
WBC: 8.9 10*3/uL (ref 4.0–10.5)
nRBC: 0 % (ref 0.0–0.2)

## 2023-06-12 ENCOUNTER — Telehealth: Payer: Self-pay | Admitting: *Deleted

## 2023-06-12 NOTE — Telephone Encounter (Signed)
 CTO scheduled at [facility] for: Thursday June 14, 2023 10:30 AM Arrival time [facility] Main Entrance A at: 8:30 AM  Nothing to eat after midnight prior to procedure, clear liquids until 5 AM day of procedure.  Medication instructions: -Usual morning medications can be taken with sips of water including aspirin 81 mg and Plavix 75 mg.  Plan to go home the same day, you will only stay overnight if medically necessary.  You must have responsible adult to drive you home.  Someone must be with you the first 24 hours after you arrive home.  Reviewed procedure instructions with patient.

## 2023-06-14 ENCOUNTER — Ambulatory Visit (HOSPITAL_COMMUNITY)
Admission: RE | Admit: 2023-06-14 | Discharge: 2023-06-15 | Disposition: A | Discharge status: Home / Self Care | Attending: Cardiology | Admitting: Cardiology

## 2023-06-14 ENCOUNTER — Other Ambulatory Visit: Payer: Self-pay

## 2023-06-14 ENCOUNTER — Ambulatory Visit (HOSPITAL_COMMUNITY)
Admission: RE | Disposition: A | Discharge status: Home / Self Care | Payer: Self-pay | Source: Home / Self Care | Attending: Cardiology

## 2023-06-14 ENCOUNTER — Encounter (HOSPITAL_COMMUNITY): Payer: Self-pay | Admitting: Cardiology

## 2023-06-14 DIAGNOSIS — J449 Chronic obstructive pulmonary disease, unspecified: Secondary | ICD-10-CM | POA: Insufficient documentation

## 2023-06-14 DIAGNOSIS — I10 Essential (primary) hypertension: Secondary | ICD-10-CM | POA: Diagnosis not present

## 2023-06-14 DIAGNOSIS — I209 Angina pectoris, unspecified: Secondary | ICD-10-CM | POA: Diagnosis present

## 2023-06-14 DIAGNOSIS — Z79899 Other long term (current) drug therapy: Secondary | ICD-10-CM | POA: Diagnosis not present

## 2023-06-14 DIAGNOSIS — I2510 Atherosclerotic heart disease of native coronary artery without angina pectoris: Secondary | ICD-10-CM

## 2023-06-14 DIAGNOSIS — J4489 Other specified chronic obstructive pulmonary disease: Secondary | ICD-10-CM | POA: Diagnosis not present

## 2023-06-14 DIAGNOSIS — E785 Hyperlipidemia, unspecified: Secondary | ICD-10-CM | POA: Diagnosis present

## 2023-06-14 DIAGNOSIS — Z7982 Long term (current) use of aspirin: Secondary | ICD-10-CM | POA: Insufficient documentation

## 2023-06-14 DIAGNOSIS — F17290 Nicotine dependence, other tobacco product, uncomplicated: Secondary | ICD-10-CM | POA: Diagnosis not present

## 2023-06-14 DIAGNOSIS — I700 Atherosclerosis of aorta: Secondary | ICD-10-CM | POA: Diagnosis not present

## 2023-06-14 DIAGNOSIS — I25119 Atherosclerotic heart disease of native coronary artery with unspecified angina pectoris: Secondary | ICD-10-CM | POA: Diagnosis not present

## 2023-06-14 DIAGNOSIS — I2582 Chronic total occlusion of coronary artery: Secondary | ICD-10-CM

## 2023-06-14 HISTORY — PX: CORONARY ANGIOGRAPHY: CATH118303

## 2023-06-14 HISTORY — PX: CORONARY CTO INTERVENTION: CATH118236

## 2023-06-14 LAB — POCT ACTIVATED CLOTTING TIME
Activated Clotting Time: 383 s
Activated Clotting Time: 210 s
Activated Clotting Time: 458 s
Activated Clotting Time: 181 s

## 2023-06-14 SURGERY — CORONARY CTO INTERVENTION
Anesthesia: LOCAL

## 2023-06-14 MED ORDER — SODIUM CHLORIDE 0.9% FLUSH: 3.0000 mL | Freq: Two times a day (BID) | INTRAVENOUS | Status: DC

## 2023-06-14 MED ORDER — LIDOCAINE HCL (PF) 1 % IJ SOLN
INTRAMUSCULAR | Status: DC | PRN
  Administered 2023-06-14: 12 mL
  Administered 2023-06-14: 10 mL

## 2023-06-14 MED ORDER — ASPIRIN 81 MG PO TBEC
81.0000 mg | DELAYED_RELEASE_TABLET | Freq: Every day | ORAL | Status: DC
  Administered 2023-06-15: 81 mg via ORAL
  Filled 2023-06-14: qty 1

## 2023-06-14 MED ORDER — MIDAZOLAM HCL 2 MG/2ML IJ SOLN
INTRAMUSCULAR | Status: DC | PRN
  Administered 2023-06-14: 1 mg via INTRAVENOUS
  Administered 2023-06-14: 2 mg via INTRAVENOUS
  Administered 2023-06-14: 1 mg via INTRAVENOUS

## 2023-06-14 MED ORDER — SODIUM CHLORIDE 0.9 % WEIGHT BASED INFUSION: 3.0000 mL/kg/h | INTRAVENOUS | Status: DC

## 2023-06-14 MED ORDER — ACETAMINOPHEN 325 MG PO TABS: 650.0000 mg | ORAL_TABLET | ORAL | Status: DC | PRN

## 2023-06-14 MED ORDER — ONDANSETRON HCL 4 MG/2ML IJ SOLN
4.0000 mg | Freq: Four times a day (QID) | INTRAMUSCULAR | Status: DC | PRN
Start: 2023-06-14 — End: 2023-06-15
  Administered 2023-06-14: 4 mg via INTRAVENOUS

## 2023-06-14 MED ORDER — HEPARIN SODIUM (PORCINE) 1000 UNIT/ML IJ SOLN
INTRAMUSCULAR | Status: AC
  Filled 2023-06-14: qty 10

## 2023-06-14 MED ORDER — ASPIRIN 81 MG PO CHEW: 81.0000 mg | CHEWABLE_TABLET | ORAL | Status: DC

## 2023-06-14 MED ORDER — HEPARIN (PORCINE) IN NACL 1000-0.9 UT/500ML-% IV SOLN
INTRAVENOUS | Status: DC | PRN
  Administered 2023-06-14 (×3): 500 mL
  Administered 2023-06-14: 4000 mL

## 2023-06-14 MED ORDER — ONDANSETRON HCL 4 MG/2ML IJ SOLN
INTRAMUSCULAR | Status: AC
  Filled 2023-06-14: qty 2

## 2023-06-14 MED ORDER — LEVOCETIRIZINE DIHYDROCHLORIDE 5 MG PO TABS: 5.0000 mg | ORAL_TABLET | Freq: Every evening | ORAL | Status: DC

## 2023-06-14 MED ORDER — FENTANYL CITRATE (PF) 100 MCG/2ML IJ SOLN
INTRAMUSCULAR | Status: DC | PRN
  Administered 2023-06-14: 50 ug via INTRAVENOUS
  Administered 2023-06-14 (×2): 25 ug via INTRAVENOUS

## 2023-06-14 MED ORDER — IOHEXOL 350 MG/ML SOLN
INTRAVENOUS | Status: DC | PRN
  Administered 2023-06-14: 140 mL

## 2023-06-14 MED ORDER — FENTANYL CITRATE (PF) 100 MCG/2ML IJ SOLN
INTRAMUSCULAR | Status: AC
  Filled 2023-06-14: qty 2

## 2023-06-14 MED ORDER — LIDOCAINE HCL (PF) 1 % IJ SOLN
INTRAMUSCULAR | Status: AC
  Filled 2023-06-14: qty 30

## 2023-06-14 MED ORDER — MORPHINE SULFATE (PF) 2 MG/ML IV SOLN
2.0000 mg | INTRAVENOUS | Status: DC | PRN
  Administered 2023-06-14: 2 mg via INTRAVENOUS

## 2023-06-14 MED ORDER — ALBUTEROL SULFATE (2.5 MG/3ML) 0.083% IN NEBU: 3.0000 mL | INHALATION_SOLUTION | Freq: Four times a day (QID) | RESPIRATORY_TRACT | Status: DC | PRN

## 2023-06-14 MED ORDER — PANTOPRAZOLE SODIUM 40 MG PO TBEC
40.0000 mg | DELAYED_RELEASE_TABLET | Freq: Two times a day (BID) | ORAL | Status: DC
  Administered 2023-06-14 – 2023-06-15 (×2): 40 mg via ORAL
  Filled 2023-06-14 (×2): qty 1

## 2023-06-14 MED ORDER — SODIUM CHLORIDE 0.9 % IV SOLN: INTRAVENOUS | Status: AC

## 2023-06-14 MED ORDER — HEPARIN SODIUM (PORCINE) 1000 UNIT/ML IJ SOLN
INTRAMUSCULAR | Status: DC | PRN
  Administered 2023-06-14: 8000 [IU] via INTRAVENOUS

## 2023-06-14 MED ORDER — METOPROLOL TARTRATE 25 MG PO TABS
25.0000 mg | ORAL_TABLET | Freq: Two times a day (BID) | ORAL | Status: DC
Start: 2023-06-14 — End: 2023-06-15
  Administered 2023-06-14 – 2023-06-15 (×2): 25 mg via ORAL
  Filled 2023-06-14: qty 2
  Filled 2023-06-14: qty 1

## 2023-06-14 MED ORDER — NITROGLYCERIN 1 MG/10 ML FOR IR/CATH LAB
INTRA_ARTERIAL | Status: AC
  Filled 2023-06-14: qty 10

## 2023-06-14 MED ORDER — CLOPIDOGREL BISULFATE 75 MG PO TABS: 75.0000 mg | ORAL_TABLET | ORAL | Status: DC

## 2023-06-14 MED ORDER — MIDAZOLAM HCL 2 MG/2ML IJ SOLN
INTRAMUSCULAR | Status: AC
  Filled 2023-06-14: qty 2

## 2023-06-14 MED ORDER — HYDROCODONE-ACETAMINOPHEN 5-325 MG PO TABS
1.0000 | ORAL_TABLET | Freq: Four times a day (QID) | ORAL | Status: DC | PRN
  Administered 2023-06-14 (×2): 1 via ORAL
  Filled 2023-06-14: qty 1

## 2023-06-14 MED ORDER — ATORVASTATIN CALCIUM 40 MG PO TABS: 40.0000 mg | ORAL_TABLET | Freq: Every day | ORAL | Status: DC

## 2023-06-14 MED ORDER — HYDROCODONE-ACETAMINOPHEN 5-325 MG PO TABS
ORAL_TABLET | ORAL | Status: AC
  Filled 2023-06-14: qty 1

## 2023-06-14 MED ORDER — FLUTICASONE FUROATE-VILANTEROL 200-25 MCG/ACT IN AEPB
1.0000 | INHALATION_SPRAY | Freq: Every day | RESPIRATORY_TRACT | Status: DC
  Administered 2023-06-15: 1 via RESPIRATORY_TRACT
  Filled 2023-06-14: qty 28

## 2023-06-14 MED ORDER — UMECLIDINIUM BROMIDE 62.5 MCG/ACT IN AEPB
1.0000 | INHALATION_SPRAY | Freq: Every day | RESPIRATORY_TRACT | Status: DC
  Administered 2023-06-15: 1 via RESPIRATORY_TRACT
  Filled 2023-06-14: qty 7

## 2023-06-14 MED ORDER — SODIUM CHLORIDE 0.9 % WEIGHT BASED INFUSION: 1.0000 mL/kg/h | INTRAVENOUS | Status: DC

## 2023-06-14 MED ORDER — MIDAZOLAM HCL 2 MG/2ML IJ SOLN
INTRAMUSCULAR | Status: AC
Start: 2023-06-14 — End: ?
  Filled 2023-06-14: qty 2

## 2023-06-14 MED ORDER — LORATADINE 10 MG PO TABS
10.0000 mg | ORAL_TABLET | Freq: Every evening | ORAL | Status: DC
  Administered 2023-06-14: 10 mg via ORAL
  Filled 2023-06-14: qty 1

## 2023-06-14 MED ORDER — MORPHINE SULFATE (PF) 2 MG/ML IV SOLN
INTRAVENOUS | Status: AC
  Filled 2023-06-14: qty 1

## 2023-06-14 MED ORDER — SODIUM CHLORIDE 0.9 % IV SOLN: 250.0000 mL | INTRAVENOUS | Status: DC | PRN

## 2023-06-14 MED ORDER — SODIUM CHLORIDE 0.9% FLUSH: 3.0000 mL | INTRAVENOUS | Status: DC | PRN

## 2023-06-14 SURGICAL SUPPLY — 19 items
CATH INFINITI JR4 5F (CATHETERS) IMPLANT
CATH MACH1 8F CLS4 (CATHETERS) IMPLANT
CATH MACH1 8FR CLS3.5 (CATHETERS) IMPLANT
CATH MAMBA 135 (CATHETERS) IMPLANT
GUIDEWIRE JUDO 3 190 (WIRE) IMPLANT
KIT ENCORE 26 ADVANTAGE (KITS) IMPLANT
KIT HEART LEFT (KITS) IMPLANT
KIT HEMO VALVE WATCHDOG (MISCELLANEOUS) IMPLANT
KIT MICROPUNCTURE NIT STIFF (SHEATH) IMPLANT
PACK CARDIAC CATHETERIZATION (CUSTOM PROCEDURE TRAY) ×1 IMPLANT
SHEATH BRITE TIP 8FR 35CM (SHEATH) IMPLANT
SHEATH PINNACLE 5F 10CM (SHEATH) IMPLANT
SHEATH PINNACLE 9F 10CM (SHEATH) IMPLANT
SHEATH PROBE COVER 6X72 (BAG) IMPLANT
TRANSDUCER W/STOPCOCK (MISCELLANEOUS) IMPLANT
TUBING CIL FLEX 10 FLL-RA (TUBING) IMPLANT
WIRE ASAHI PROWATER 180CM (WIRE) IMPLANT
WIRE EMERALD 3MM-J .035X150CM (WIRE) IMPLANT
WIRE FIGHTER CROSSING 190CM (WIRE) IMPLANT

## 2023-06-14 NOTE — Progress Notes (Signed)
 Pt called RN to room, bleeding to right groin noted, drsg saturated, removed, and manual pressure held x 20 minutes, gauze with tegaderm applied to site, safety maintained

## 2023-06-14 NOTE — Interval H&P Note (Signed)
 History and Physical Interval Note:  06/14/2023 10:44 AM  Kyle Clark  has presented today for surgery, with the diagnosis of cto.  The various methods of treatment have been discussed with the patient and family. After consideration of risks, benefits and other options for treatment, the patient has consented to  Procedure(s): CORONARY CTO INTERVENTION (N/A) as a surgical intervention.  The patient's history has been reviewed, patient examined, no change in status, stable for surgery.  I have reviewed the patient's chart and labs.  Questions were answered to the patient's satisfaction.    Cath Lab Visit (complete for each Cath Lab visit)  Clinical Evaluation Leading to the Procedure:   ACS: No.  Non-ACS:    Anginal Classification: CCS III  Anti-ischemic medical therapy: Minimal Therapy (1 class of medications)  Non-Invasive Test Results: Intermediate-risk stress test findings: cardiac mortality 1-3%/year  Prior CABG: No previous CABG       Theron Arista [facility] 06/14/2023 10:45 AM

## 2023-06-14 NOTE — Progress Notes (Signed)
 Additional pressure to right groin held for 20 mins.  Level 0, soft and tender to touch, and brusing.

## 2023-06-14 NOTE — Progress Notes (Addendum)
 SITE AREA: right groin/femoral  SITE PRIOR TO REMOVAL:  LEVEL 1  PRESSURE APPLIED FOR: approximately 45 minutes  MANUAL: yes  PATIENT STATUS DURING PULL: stable, c/o pain, See MAR  POST PULL SITE:  LEVEL 1  POST PULL INSTRUCTIONS GIVEN: Yes, drsg to bilateral groins x 24 hours, may shower tomorrow, no sitting in water, no hot tubs, bath tubs, or pools x 1 week, take it easy on stairs and climbing into and out of vehicles, if in hospital inform staff if you feel anything warm and moist by groin areas, if at home and bleeding occurs, call 911  POST PULL PULSES PRESENT: bilateral pedal pulses at +2  DRESSING APPLIED: gauze with tegaderm  BEDREST BEGINS @ 1558  COMMENTS: bruise and hematoma noted to right groin prior to sheath removal, see flowsheets

## 2023-06-14 NOTE — Progress Notes (Signed)
 Site area: Left groin a 5 french arterial sheathe was removed  Site Prior to Removal:  Level 0  Pressure Applied For 25 MINUTES    Minutes Beginning at   Manual:   Yes.    Patient Status During Pull:  stable  Post Pull Groin Site:  Level 0  Post Pull Instructions Given:  Yes.    Post Pull Pulses Present:  Yes.    Dressing Applied:  Yes.    Comments:

## 2023-06-15 ENCOUNTER — Ambulatory Visit: Payer: 59 | Admitting: Cardiology

## 2023-06-15 ENCOUNTER — Encounter (HOSPITAL_COMMUNITY): Payer: Self-pay | Admitting: Cardiology

## 2023-06-15 DIAGNOSIS — I25119 Atherosclerotic heart disease of native coronary artery with unspecified angina pectoris: Secondary | ICD-10-CM | POA: Diagnosis not present

## 2023-06-15 DIAGNOSIS — J449 Chronic obstructive pulmonary disease, unspecified: Secondary | ICD-10-CM | POA: Insufficient documentation

## 2023-06-15 DIAGNOSIS — I2582 Chronic total occlusion of coronary artery: Secondary | ICD-10-CM | POA: Diagnosis not present

## 2023-06-15 DIAGNOSIS — I700 Atherosclerosis of aorta: Secondary | ICD-10-CM | POA: Diagnosis not present

## 2023-06-15 DIAGNOSIS — E785 Hyperlipidemia, unspecified: Secondary | ICD-10-CM | POA: Diagnosis not present

## 2023-06-15 LAB — BASIC METABOLIC PANEL WITH GFR
Anion gap: 7 (ref 5–15)
BUN: 9 mg/dL (ref 6–20)
CO2: 25 mmol/L (ref 22–32)
Calcium: 8.8 mg/dL — ABNORMAL LOW (ref 8.9–10.3)
Chloride: 104 mmol/L (ref 98–111)
Creatinine, Ser: 1.01 mg/dL (ref 0.61–1.24)
GFR, Estimated: >60 mL/min (ref >60)
Glucose, Bld: 99 mg/dL (ref 70–99)
Potassium: 4.5 mmol/L (ref 3.5–5.1)
Sodium: 136 mmol/L (ref 135–145)

## 2023-06-15 LAB — CBC
HCT: 42.2 % (ref 39.0–52.0)
Hemoglobin: 14.7 g/dL (ref 13.0–17.0)
MCH: 29.9 pg (ref 26.0–34.0)
MCHC: 34.8 g/dL (ref 30.0–36.0)
MCV: 85.8 fL (ref 80.0–100.0)
Platelets: 254 10*3/uL (ref 150–400)
RBC: 4.92 MIL/uL (ref 4.22–5.81)
RDW: 11.6 % (ref 11.5–15.5)
WBC: 8.8 10*3/uL (ref 4.0–10.5)
nRBC: 0 % (ref 0.0–0.2)

## 2023-06-15 MED ORDER — ATORVASTATIN CALCIUM 80 MG PO TABS: 80.0000 mg | ORAL_TABLET | Freq: Every day | ORAL | 3 refills | Status: AC

## 2023-06-15 MED ORDER — NITROGLYCERIN 0.4 MG SL SUBL: 0.4000 mg | SUBLINGUAL_TABLET | SUBLINGUAL | 3 refills | Status: AC | PRN

## 2023-06-15 MED ORDER — ATORVASTATIN CALCIUM 80 MG PO TABS
80.0000 mg | ORAL_TABLET | Freq: Every day | ORAL | Status: DC
  Administered 2023-06-15: 80 mg via ORAL
  Filled 2023-06-15: qty 1

## 2023-06-15 MED FILL — Nitroglycerin IV Soln 100 MCG/ML in D5W: INTRA_ARTERIAL | Qty: 10 | Status: AC

## 2023-06-15 NOTE — Discharge Summary (Signed)
 Discharge Summary    Patient ID: Kyle Clark MRN: [id number]; DOB: [birth year]  Admit date: 06/14/2023 Discharge date: 06/15/2023  PCP:  [facility]   [facility] Providers Cardiologist:  Debbe Odea, MD    Discharge Diagnoses    Principal Problem:   Angina pectoris [facility]) Active Problems:   Hyperlipidemia   Hypertension   COPD (chronic obstructive pulmonary disease) [facility])   Diagnostic Studies/Procedures    Cath: 06/14/2023    Prox LAD to Mid LAD lesion is 100% stenosed.   Post intervention, there is a 100% residual stenosis.   The radiation dose exceeded thresholds defined in the "Patient Radiation Dose Management For Interventional Medical Procedures With Extensive Use of Fluoroscopy" policy. Specific follow up instructions will be provided to the patient prior to discharge.   Unsuccessful PCI of the mid LAD occlusion - unable to cross with a wire.    Plan; will observe overnight. May discontinue Plavix. I would treat his CAD medically since symptoms are more dyspnea and not chest pain and he doe have lung disease as well.  _____________   History of Present Illness     Kyle Clark is a 53 y.o. male with history of CAD, aortic atherosclerosis, COPD, asthma, prior tobacco use quitting in 2021, HTN, and HLD who presented to the office on 3/12 for follow-up of abnormal coronary CTA.   Prior CT, performed for lung cancer screening, in 12/2022 showed coronary artery calcifications involving the LAD and LCx.   He was seen as a new patient by Dr. Azucena Cecil on 05/04/2023 noting an approximate 2-year history of progressive exertional dyspnea that has been more noticeable as of late.  At that time, he also reported having previously been on medication for hypertension, though BP normalized and he was monitored off pharmacotherapy.  Blood pressure was elevated in the office at 144/100.  He was started on aspirin 81 mg and atorvastatin 20 mg.  Echo  and coronary CTA were recommended.  Coronary CTA on 05/17/2023 showed a calcium score of 949, which was the 99th percentile.  There was greater than 70% proximal LAD stenosis with 25 to 49% proximal LCx and RCA stenoses.  ctFFR of 0.78 in the proximal to mid LAD with normal FFRct of the LCx and RCA.  Echo pending at this time.    Presented to the office on 3/12 and continued to note exertional dyspnea as well as insomnia exertional chest discomfort and at times and intermittent sharp pinpoint chest discomfort that occurs randomly.  He would become short of breath with activities such as taking the trash cans out to the road.  With this, there was some associated fatigue as well. He did quit smoking in 2021, though currently vapes. He was set up for outpatient cardiac cath with Dr. Excell Seltzer on 06/01/2023 with unsuccessful attempt of LAD with inability to cross lesion.  Case was reviewed with Dr. [place] and started on plavix. Brought back for CTO intervention.     Hospital Course     CAD -- Underwent cardiac catheterization 4/3 with unsuccessful CTO of LAD due to inability to cross lesion with a wire.  Plavix stopped, will continue aspirin.  Recommendations to continue to treat his CAD medically given symptoms were more dyspnea related and not anginal.  He was able to ambulate in the hallway without symptoms. -- Increase atorvastatin to 80 mg daily, add as needed sublingual nitroglycerin  Hypertension -- Controlled, continue metoprolol 25 mg twice daily  COPD  History of tobacco use  General: Well developed, well nourished, male appearing in no acute distress. Head: Normocephalic, atraumatic.  Neck: Supple without bruits, JVD. Lungs:  Resp regular and unlabored, CTA. Heart: RRR, S1, S2, no S3, S4, or murmur; no rub. Abdomen: Soft, non-tender, non-distended with normoactive bowel sounds. No hepatomegaly. No rebound/guarding. No obvious abdominal masses. Extremities: No clubbing, cyanosis, edema. Distal  pedal pulses are 2+ bilaterally. Right femoral cath site stable with bruising but no bruit/hematoma, left femoral cath site stable.  Neuro: Alert and oriented X 3. Moves all extremities spontaneously. Psych: Normal affect.  Patient seen by myself and Dr. Jacinto Halim, deemed stable for discharge home.  Follow-up arranged in office.  Medication sent to patient's pharmacy.  Did the patient have an acute coronary syndrome (MI, NSTEMI, STEMI, etc) this admission?:  No                               Did the patient have a percutaneous coronary intervention (stent / angioplasty)?:  No.    The patient will be scheduled for a TOC follow up appointment in 10-14 days.  A message has been sent to the [facility] and Scheduling Pool at the office where the patient should be seen for follow up.  _____________  Discharge Vitals Blood pressure (!) 133/98, pulse 88, temperature 98.8 F (37.1 C), temperature source Oral, resp. rate 18, height 5\' 8"  (1.727 m), weight 73.5 kg, SpO2 98%.  Filed Weights   06/14/23 0858  Weight: 73.5 kg    Labs & Radiologic Studies    CBC Recent Labs    06/15/23 0541  WBC 8.8  HGB 14.7  HCT 42.2  MCV 85.8  PLT 254   Basic Metabolic Panel Recent Labs    95/62/13 0541  NA 136  K 4.5  CL 104  CO2 25  GLUCOSE 99  BUN 9  CREATININE 1.01  CALCIUM 8.8*   Liver Function Tests No results for input(s): "AST", "ALT", "ALKPHOS", "BILITOT", "PROT", "ALBUMIN" in the last 72 hours. No results for input(s): "LIPASE", "AMYLASE" in the last 72 hours. High Sensitivity Troponin:   No results for input(s): "TROPONINIHS" in the last 720 hours.  BNP Invalid input(s): "POCBNP" D-Dimer No results for input(s): "DDIMER" in the last 72 hours. Hemoglobin A1C No results for input(s): "HGBA1C" in the last 72 hours. Fasting Lipid Panel No results for input(s): "CHOL", "HDL", "LDLCALC", "TRIG", "CHOLHDL", "LDLDIRECT" in the last 72 hours. Thyroid Function Tests No results for input(s):  "TSH", "T4TOTAL", "T3FREE", "THYROIDAB" in the last 72 hours.  Invalid input(s): "FREET3" _____________  CARDIAC CATHETERIZATION Result Date: 06/14/2023   Prox LAD to Mid LAD lesion is 100% stenosed.   Post intervention, there is a 100% residual stenosis.   The radiation dose exceeded thresholds defined in the "Patient Radiation Dose Management For Interventional Medical Procedures With Extensive Use of Fluoroscopy" policy. Specific follow up instructions will be provided to the patient prior to discharge. Unsuccessful PCI of the mid LAD occlusion - unable to cross with a wire. Plan; will observe overnight. May discontinue Plavix. I would treat his CAD medically since symptoms are more dyspnea and not chest pain and he doe have lung disease as well.   CARDIAC CATHETERIZATION Result Date: 06/01/2023 1.  Severe single-vessel coronary artery disease with total occlusion of the proximal LAD at the first diagonal branch, both right to left and left to left collaterals identified 2.  Patent left  main, left circumflex, and RCA with mild nonobstructive plaquing 3.  Normal LV systolic function with LVEF estimated at 55% 4.  Unsuccessful PCI of the LAD due to inability to cross the occlusion Recommendation: Start clopidogrel, start metoprolol for antianginal therapy, refer for CTO-PCI.  Case discussed with Dr. [place].   CT CORONARY MORPH W/CTA COR W/SCORE W/CA W/CM &/OR WO/CM Addendum Date: 05/30/2023 ADDENDUM REPORT: 05/30/2023 21:25 ADDENDUM: OVER-READ INTERPRETATION  CT CHEST The following report is an over-read performed by radiologist Dr. Rowe Robert [facility] on 05/30/2023. This over-read does not include interpretation of cardiac or coronary anatomy or pathology. The interpretation by the cardiologist is attached. COMPARISON:  CT chest December 25, 2022 FINDINGS: Comparison with the prior examination accounting for differences in techniques no significant change in the chronic interstitial  lung disease. No change in the left upper lobe bleb previously described. No acute infiltrates or consolidations. Left major fissure thickening likely chronic. No evidence of pulmonary nodules or masses. No pleural effusions No pericardial effusions Coronary artery calcifications as previously described. IMPRESSION: *Chronic interstitial lung disease. *No acute infiltrates or consolidations. *No evidence of pulmonary nodules or masses. *Coronary artery calcifications as previously described. Electronically Signed   By: Shaaron Adler M.D.   On: 05/30/2023 21:25   Result Date: 05/30/2023 CLINICAL DATA:  Dyspnea on exertion EXAM: Cardiac/Coronary  CTA TECHNIQUE: The patient was scanned on a Siemens Somatom go.Top scanner. : A retrospective scan was triggered in the ascending thoracic aorta. Axial non-contrast 3 mm slices were carried out through the heart. The data set was analyzed on a dedicated work station and scored using the Agatson method. Gantry rotation speed was 330 msecs and collimation was .6 mm. 100mg  of metoprolol and 0.8 mg of sl NTG was given. The 3D data set was reconstructed in 5% intervals of the 60-95 % of the R-R cycle. Diastolic phases were analyzed on a dedicated work station using MPR, MIP and VRT modes. The patient received 75 cc of contrast. FINDINGS: Aorta:  Normal size.  No calcifications.  No dissection. Aortic Valve:  Trileaflet.  No calcifications. Coronary Arteries:  Normal coronary origin.  Right dominance. RCA is a dominant artery. There is calcified plaque proximally causing mild stenosis (25-49%). Left main gives rise to LAD and LCX arteries. LM has no disease. LAD has calcified plaque proximally causing severe stenosis (70%). LCX is a non-dominant artery. There is calcified plaque proximally causing mild stenosis (25-49%). Other findings: Normal pulmonary vein drainage into the left atrium. Normal left atrial appendage without a thrombus. Normal size of the pulmonary artery.  IMPRESSION: 1. Coronary calcium score of 949. This was 99th percentile for age and sex matched control. 2. Normal coronary origin with right dominance. 3. Severe proximal LAD stenosis (>70%). 4. Mild proximal RCA and LCx stenosis (25-49%). 5. CAD-RADS 4 Severe stenosis. (70-99% or > 50% left main). Cardiac catheterization is recommended. Consider symptom-guided anti-ischemic pharmacotherapy as well as risk factor modification per guideline directed care. 6. Additional analysis with CT FFR will be submitted and reported separately. Electronically Signed: By: Debbe Odea M.D. On: 05/17/2023 13:33   ECHOCARDIOGRAM COMPLETE Result Date: 05/30/2023    ECHOCARDIOGRAM REPORT   Patient Name:   Kyle NUZUM Date of Exam: 05/30/2023 Medical Rec #:  [id number]        Height:       68.0 in Accession #:    [id number]       Weight:       162.2 lb  Date of Birth:  Jul 29, 1970       BSA:          1.870 m Patient Age:    52 years         BP:           144/100 mmHg Patient Gender: M                HR:           92 bpm. Exam Location:  [place] Procedure: 2D Echo, 3D Echo, Cardiac Doppler, Color Doppler and Strain Analysis            (Both Spectral and Color Flow Doppler were utilized during            procedure). Indications:    R06.9 DOE  History:        Patient has no prior history of Echocardiogram examinations.                 CAD, COPD, Signs/Symptoms:Shortness of Breath and Dyspnea; Risk                 Factors:Hypertension, Dyslipidemia and Former Smoker.  Sonographer:    Ilda Mori MHA, BS, RDCS Referring Phys: [id number] [facility]  Sonographer Comments: Suboptimal parasternal window. Low parasternal window. Dilated AO root IMPRESSIONS  1. Left ventricular ejection fraction, by estimation, is 60 to 65%. Left ventricular ejection fraction by 3D volume is 46 %. The left ventricle has normal function. The left ventricle has no regional wall motion abnormalities. Left ventricular diastolic  parameters are  consistent with Grade I diastolic dysfunction (impaired relaxation). The average left ventricular global longitudinal strain is -13.3 %.  2. Right ventricular systolic function is normal. The right ventricular size is normal. Tricuspid regurgitation signal is inadequate for assessing PA pressure.  3. The mitral valve is normal in structure. No evidence of mitral valve regurgitation. No evidence of mitral stenosis.  4. The aortic valve has an indeterminant number of cusps. Aortic valve regurgitation is not visualized. No aortic stenosis is present.  5. There is mild dilatation of the aortic root, measuring 42 mm.  6. The inferior vena cava is normal in size with greater than 50% respiratory variability, suggesting right atrial pressure of 3 mmHg. FINDINGS  Left Ventricle: Left ventricular ejection fraction, by estimation, is 60 to 65%. Left ventricular ejection fraction by 3D volume is 46 %. The left ventricle has normal function. The left ventricle has no regional wall motion abnormalities. The average left ventricular global longitudinal strain is -13.3 %. Strain was performed and the global longitudinal strain is indeterminate. The left ventricular internal cavity size was normal in size. There is no left ventricular hypertrophy. Left ventricular diastolic parameters are consistent with Grade I diastolic dysfunction (impaired relaxation). Right Ventricle: The right ventricular size is normal. No increase in right ventricular wall thickness. Right ventricular systolic function is normal. Tricuspid regurgitation signal is inadequate for assessing PA pressure. Left Atrium: Left atrial size was normal in size. Right Atrium: Right atrial size was normal in size. Pericardium: There is no evidence of pericardial effusion. Mitral Valve: The mitral valve is normal in structure. No evidence of mitral valve regurgitation. No evidence of mitral valve stenosis. Tricuspid Valve: The tricuspid valve is normal in structure.  Tricuspid valve regurgitation is not demonstrated. No evidence of tricuspid stenosis. Aortic Valve: The aortic valve has an indeterminant number of cusps. Aortic valve regurgitation is not visualized. No aortic stenosis is present. Aortic valve mean  gradient measures 3.0 mmHg. Aortic valve peak gradient measures 4.9 mmHg. Pulmonic Valve: The pulmonic valve was normal in structure. Pulmonic valve regurgitation is not visualized. No evidence of pulmonic stenosis. Aorta: The aortic root is normal in size and structure. There is mild dilatation of the aortic root, measuring 42 mm. Venous: The inferior vena cava is normal in size with greater than 50% respiratory variability, suggesting right atrial pressure of 3 mmHg. IAS/Shunts: No atrial level shunt detected by color flow Doppler. Additional Comments: 3D was performed not requiring image post processing on an independent workstation and was abnormal.  LEFT VENTRICLE PLAX 2D LVIDd:         4.90 cm         Diastology LVIDs:         3.60 cm         LV e' medial:    6.31 cm/s LV PW:         0.90 cm         LV E/e' medial:  7.6 LV IVS:        0.80 cm         LV e' lateral:   7.83 cm/s                                LV E/e' lateral: 6.1                                 2D Longitudinal                                Strain                                2D Strain GLS   -13.3 %                                Avg:                                 3D Volume EF                                LV 3D EF:    Left                                             ventricul                                             ar                                             ejection  fraction                                             by 3D                                             volume is                                             46 %.                                 3D Volume EF:                                3D EF:        46 %                                 LV EDV:       106 ml                                LV ESV:       57 ml                                LV SV:        49 ml RIGHT VENTRICLE RV Basal diam:  2.50 cm RV Mid diam:    2.60 cm RV S prime:     12.80 cm/s TAPSE (M-mode): 1.1 cm LEFT ATRIUM             Index        RIGHT ATRIUM           Index LA diam:        4.00 cm 2.14 cm/m   RA Area:     13.60 cm LA Vol (A2C):   39.3 ml 21.02 ml/m  RA Volume:   28.90 ml  15.46 ml/m LA Vol (A4C):   31.1 ml 16.63 ml/m LA Biplane Vol: 35.6 ml 19.04 ml/m  AORTIC VALVE AV Vmax:           111.00 cm/s AV Vmean:          73.800 cm/s AV VTI:            0.176 m AV Peak Grad:      4.9 mmHg AV Mean Grad:      3.0 mmHg LVOT Vmax:         73.40 cm/s LVOT Vmean:        47.700 cm/s LVOT VTI:          0.112 m LVOT/AV VTI ratio: 0.64  AORTA Ao Root diam: 4.20 cm MITRAL VALVE MV Area (PHT): 4.39 cm    SHUNTS MV Decel Time: 173 msec    Systemic VTI: 0.11 m MV E  velocity: 47.70 cm/s MV A velocity: 79.70 cm/s MV E/A ratio:  0.60 Julien Nordmann MD Electronically signed by Julien Nordmann MD Signature Date/Time: 05/30/2023/6:26:48 PM    Final    CT CORONARY FFR DATA PREP & FLUID ANALYSIS Result Date: 05/17/2023 EXAM: CT FFR ANALYSIS CLINICAL DATA:  Abnormal CCTA FINDINGS: FFRct analysis was performed on the original cardiac CT angiogram dataset. Diagrammatic representation of the FFRct analysis is provided in a separate PDF document in PACS. This dictation was created using the PDF document and an interactive 3D model of the results. 3D model is not available in the EMR/PACS. Normal FFR range is >0.80. 1. Left Main:  No significant stenosis. 2. LAD: significant stenosis in proximal-mid segment.  FFRct 0.78 3. LCX: No significant stenosis.  FFRct 0.96 4. RCA: No significant stenosis.  FFRct 0.90 IMPRESSION: 1. CT FFR analysis showed significant stenosis in the proximal-mid LAD. FFRct 0.78 2.  Recommend cardiac catheterization. Electronically Signed   By: Debbe Odea M.D.    On: 05/17/2023 15:19   Disposition   Pt is being discharged home today in good condition.  Follow-up Plans & Appointments     Discharge Instructions     AMB referral to Phase II Cardiac Rehabilitation   Complete by: As directed    Diagnosis: Stable Angina   After initial evaluation and assessments completed: Virtual Based Care may be provided alone or in conjunction with Phase 2 Cardiac Rehab based on patient barriers.: Yes   Intensive Cardiac Rehabilitation (ICR) [facility] location only OR Traditional Cardiac Rehabilitation (TCR) *If criteria for ICR are not met will enroll in TCR [facility] only): Yes   Call MD for:  difficulty breathing, headache or visual disturbances   Complete by: As directed    Call MD for:  redness, tenderness, or signs of infection (pain, swelling, redness, odor or green/yellow discharge around incision site)   Complete by: As directed    Diet - low sodium heart healthy   Complete by: As directed    Discharge instructions   Complete by: As directed    Groin Site Care Refer to this sheet in the next few weeks. These instructions provide you with information on caring for yourself after your procedure. Your caregiver may also give you more specific instructions. Your treatment has been planned according to current medical practices, but problems sometimes occur. Call your caregiver if you have any problems or questions after your procedure. HOME CARE INSTRUCTIONS You may shower 24 hours after the procedure. Remove the bandage (dressing) and gently wash the site with plain soap and water. Gently pat the site dry.  Do not apply powder or lotion to the site.  Do not sit in a bathtub, swimming pool, or whirlpool for 5 to 7 days.  No bending, squatting, or lifting anything over 10 pounds (4.5 kg) as directed by your caregiver.  Inspect the site at least twice daily.  Do not drive home if you are discharged the same day of the procedure. Have someone else drive you.  You may  drive 24 hours after the procedure unless otherwise instructed by your caregiver.  What to expect: Any bruising will usually fade within 1 to 2 weeks.  Blood that collects in the tissue (hematoma) may be painful to the touch. It should usually decrease in size and tenderness within 1 to 2 weeks.  SEEK IMMEDIATE MEDICAL CARE IF: You have unusual pain at the groin site or down the affected leg.  You have redness, warmth, swelling,  or pain at the groin site.  You have drainage (other than a small amount of blood on the dressing).  You have chills.  You have a fever or persistent symptoms for more than 72 hours.  You have a fever and your symptoms suddenly get worse.  Your leg becomes pale, cool, tingly, or numb.  You have heavy bleeding from the site. Hold pressure on the site.  Patients taking blood thinners should generally stay away from medicines like ibuprofen, Advil, Motrin, naproxen, and Aleve due to risk of stomach bleeding. You may take Tylenol as directed or talk to your primary doctor about alternatives.   Increase activity slowly   Complete by: As directed         Discharge Medications   Allergies as of 06/15/2023       Reactions   Lisinopril Palpitations   Other reaction(s): Rapid pulse High blood pressure         Medication List     STOP taking these medications    clopidogrel 75 MG tablet Commonly known as: Plavix   loratadine 10 MG tablet Commonly known as: CLARITIN       TAKE these medications    albuterol 108 (90 Base) MCG/ACT inhaler Commonly known as: VENTOLIN HFA INHALE 1-2 PUFFS BY MOUTH EVERY 6 HOURS AS NEEDED FOR WHEEZE OR SHORTNESS OF BREATH   aspirin EC 81 MG tablet Take 1 tablet (81 mg total) by mouth daily. Swallow whole.   atorvastatin 80 MG tablet Commonly known as: LIPITOR Take 1 tablet (80 mg total) by mouth daily. What changed:  medication strength how much to take   EPINEPHrine 0.3 mg/0.3 mL Soaj injection Commonly known  as: EPI-PEN Inject 0.3 mg into the muscle as needed.   fluticasone 50 MCG/ACT nasal spray Commonly known as: FLONASE Place 1 spray into both nostrils as needed. What changed:  when to take this reasons to take this   levocetirizine 5 MG tablet Commonly known as: XYZAL TAKE 1 TABLET BY MOUTH EVERY DAY IN THE EVENING   metoprolol tartrate 25 MG tablet Commonly known as: LOPRESSOR Take 1 tablet (25 mg total) by mouth 2 (two) times daily.   montelukast 10 MG tablet Commonly known as: SINGULAIR TAKE 1 TABLET BY MOUTH EVERYDAY AT BEDTIME What changed: See the new instructions.   nitroGLYCERIN 0.4 MG SL tablet Commonly known as: Nitrostat Place 1 tablet (0.4 mg total) under the tongue every 5 (five) minutes as needed for chest pain.   pantoprazole 40 MG tablet Commonly known as: PROTONIX Take 1 tablet (40 mg total) by mouth 2 (two) times daily.   Trelegy Ellipta 200-62.5-25 MCG/ACT Aepb Generic drug: Fluticasone-Umeclidin-Vilant Inhale 1 puff into the lungs daily.          Outstanding Labs/Studies   FLP/LFTs in 8 weeks   Duration of Discharge Encounter: APP Time: 20 minutes   Signed, Laverda Page, NP 06/15/2023, 9:14 AM

## 2023-06-15 NOTE — Progress Notes (Signed)
 Pt without PCI therefore will not see. Could be referred from MD office for CRPII with dx of stable angina if deemed appropriate.  Ethelda Chick BS, ACSM-CEP 06/15/2023 9:19 AM

## 2023-06-18 LAB — LIPOPROTEIN A (LPA): Lipoprotein (a): 44.3 nmol/L — ABNORMAL HIGH (ref <75.0)

## 2023-06-26 ENCOUNTER — Ambulatory Visit: Admitting: Cardiology

## 2023-07-05 ENCOUNTER — Ambulatory Visit: Attending: Cardiology | Admitting: Cardiology

## 2023-07-05 ENCOUNTER — Encounter: Payer: Self-pay | Admitting: Cardiology

## 2023-07-05 VITALS — BP 138/92 | HR 70 | Ht 68.0 in | Wt 161.2 lb

## 2023-07-05 DIAGNOSIS — R0609 Other forms of dyspnea: Secondary | ICD-10-CM | POA: Diagnosis not present

## 2023-07-05 DIAGNOSIS — E785 Hyperlipidemia, unspecified: Secondary | ICD-10-CM | POA: Diagnosis not present

## 2023-07-05 DIAGNOSIS — I10 Essential (primary) hypertension: Secondary | ICD-10-CM | POA: Diagnosis not present

## 2023-07-05 DIAGNOSIS — I2510 Atherosclerotic heart disease of native coronary artery without angina pectoris: Secondary | ICD-10-CM

## 2023-07-05 MED ORDER — METOPROLOL TARTRATE 50 MG PO TABS: 50.0000 mg | ORAL_TABLET | Freq: Two times a day (BID) | ORAL | 3 refills | Status: AC

## 2023-07-05 NOTE — Progress Notes (Signed)
 Cardiology Office Note:    Date:  07/05/2023   ID:  LOGIN MUCKLEROY, DOB 1970/06/11, MRN [id number]  PCP:  [facility]   [facility] Providers Cardiologist:  Constancia Delton, MD     Referring MD: [facility]*   Chief Complaint  Patient presents with   Follow up s/p cardiac cath     Patient c/o shortness of breath with little to no activity.     History of Present Illness:    Kyle Clark is a 53 y.o. male with a hx of  CAD (CTO proximal LAD s/p failed intervention 06/2023), hypertension, COPD, former smoker x 30+ years presenting for follow-up.  Being seen for CAD.  Denies chest pain, still has shortness of breath with exertion which is chronic.  Also has COPD.  Lipitor increased to 80 mg daily for adequate cholesterol control.  Tolerating Lipitor with no issues.  Started on Lopressor  25 mg twice daily for antianginal benefit and BP control.  Patient states doing okay, still has shortness of breath with exertion which is chronic.  Has upcoming follow-up with pulmonary medicine.  Prior notes/testing CT chest 12/2022 for lung cancer screening showed LAD and RCA calcifications  Past Medical History:  Diagnosis Date   Allergic rhinitis    CAD (coronary artery disease)    Hyperlipidemia    Hypertension     Past Surgical History:  Procedure Laterality Date   CORONARY ANGIOGRAPHY N/A 06/14/2023   Procedure: CORONARY ANGIOGRAPHY;  Surgeon: Swaziland, Peter M, MD;  Location: [facility] INVASIVE CV LAB;  Service: Cardiovascular;  Laterality: N/A;   CORONARY CTO INTERVENTION N/A 06/14/2023   Procedure: CORONARY CTO INTERVENTION;  Surgeon: Swaziland, Peter M, MD;  Location: [facility] INVASIVE CV LAB;  Service: Cardiovascular;  Laterality: N/A;   ELBOW SURGERY     01/2021   ESOPHAGOGASTRODUODENOSCOPY N/A 11/17/2020   Procedure: ESOPHAGOGASTRODUODENOSCOPY (EGD) with possible intervention;  Surgeon: Quintin Buckle, DO;  Location: [facility] ENDOSCOPY;  Service:  Gastroenterology;  Laterality: N/A;  food impaction   LEFT HEART CATH AND CORONARY ANGIOGRAPHY N/A 06/01/2023   Procedure: LEFT HEART CATH AND CORONARY ANGIOGRAPHY;  Surgeon: Arnoldo Lapping, MD;  Location: [facility] INVASIVE CV LAB;  Service: Cardiovascular;  Laterality: N/A;    Current Medications: Current Meds  Medication Sig   albuterol  (VENTOLIN  HFA) 108 (90 Base) MCG/ACT inhaler INHALE 1-2 PUFFS BY MOUTH EVERY 6 HOURS AS NEEDED FOR WHEEZE OR SHORTNESS OF BREATH   aspirin  EC 81 MG tablet Take 1 tablet (81 mg total) by mouth daily. Swallow whole.   atorvastatin  (LIPITOR) 80 MG tablet Take 1 tablet (80 mg total) by mouth daily.   EPINEPHrine  0.3 mg/0.3 mL IJ SOAJ injection Inject 0.3 mg into the muscle as needed.   fluticasone  (FLONASE ) 50 MCG/ACT nasal spray Place 1 spray into both nostrils as needed. (Patient taking differently: Place 1 spray into both nostrils daily as needed for allergies or rhinitis.)   Fluticasone -Umeclidin-Vilant (TRELEGY ELLIPTA ) 200-62.5-25 MCG/ACT AEPB Inhale 1 puff into the lungs daily.   levocetirizine (XYZAL ) 5 MG tablet TAKE 1 TABLET BY MOUTH EVERY DAY IN THE EVENING   metoprolol  tartrate (LOPRESSOR ) 50 MG tablet Take 1 tablet (50 mg total) by mouth 2 (two) times daily.   montelukast  (SINGULAIR ) 10 MG tablet TAKE 1 TABLET BY MOUTH EVERYDAY AT BEDTIME (Patient taking differently: Take 10 mg by mouth daily as needed (Allergies).)   nitroGLYCERIN  (NITROSTAT ) 0.4 MG SL tablet Place 1 tablet (0.4 mg total) under the tongue  every 5 (five) minutes as needed for chest pain.   pantoprazole  (PROTONIX ) 40 MG tablet Take 1 tablet (40 mg total) by mouth 2 (two) times daily.   [DISCONTINUED] metoprolol  tartrate (LOPRESSOR ) 25 MG tablet Take 1 tablet (25 mg total) by mouth 2 (two) times daily.     Allergies:   Lisinopril   Social History   Socioeconomic History   Marital status: Married    Spouse name: Not on file   Number of children: Not on file   Years of education: Not on  file   Highest education level: Not on file  Occupational History   Not on file  Tobacco Use   Smoking status: Former    Current packs/day: 0.00    Average packs/day: 1.5 packs/day for 31.0 years (46.5 ttl pk-yrs)    Types: Cigarettes    Quit date: 03/14/2019    Years since quitting: 4.3   Smokeless tobacco: Never  Vaping Use   Vaping status: Every Day   Substances: Nicotine   Substance and Sexual Activity   Alcohol use: Yes    Comment: occasional alcohol use, drinks beer on weekends maybe once a week   Drug use: No   Sexual activity: Not on file  Other Topics Concern   Not on file  Social History Narrative   Not on file   Social Drivers of Health   Financial Resource Strain: Not on file  Food Insecurity: No Food Insecurity (06/14/2023)   Hunger Vital Sign    Worried About Running Out of Food in the Last Year: Never true    Ran Out of Food in the Last Year: Never true  Transportation Needs: No Transportation Needs (06/14/2023)   PRAPARE - Administrator, Civil Service (Medical): No    Lack of Transportation (Non-Medical): No  Physical Activity: Not on file  Stress: Not on file  Social Connections: Not on file     Family History: The patient's family history includes CAD in his mother; Congestive Heart Failure in his father; Healthy in his daughter and sister.  ROS:   Please see the history of present illness.     All other systems reviewed and are negative.  EKGs/Labs/Other Studies Reviewed:    The following studies were reviewed today:  EKG Interpretation Date/Time:  Thursday July 05 2023 08:49:36 EDT Ventricular Rate:  70 PR Interval:  180 QRS Duration:  90 QT Interval:  384 QTC Calculation: 414 R Axis:   -6  Text Interpretation: Normal sinus rhythm Normal ECG Confirmed by Constancia Delton ([id number]) on 07/05/2023 8:54:05 AM    Recent Labs: 05/23/2023: ALT 53 06/15/2023: BUN 9; Creatinine, Ser 1.01; Hemoglobin 14.7; Platelets 254; Potassium 4.5;  Sodium 136  Recent Lipid Panel    Component Value Date/Time   CHOL 172 05/23/2023 1455   TRIG 215 (H) 05/23/2023 1455   HDL 51 05/23/2023 1455   CHOLHDL 3.4 05/23/2023 1455   LDLCALC 85 05/23/2023 1455   LDLDIRECT 90 05/23/2023 1455     Risk Assessment/Calculations:         Physical Exam:    VS:  BP (!) 138/92 (BP Location: Left Arm, Patient Position: Sitting, Cuff Size: Normal)   Pulse 70   Ht 5\' 8"  (1.727 m)   Wt 161 lb 4 oz (73.1 kg)   SpO2 98%   BMI 24.52 kg/m     Wt Readings from Last 3 Encounters:  07/05/23 161 lb 4 oz (73.1 kg)  06/14/23 162 lb (73.5 kg)  06/01/23 162 lb (73.5 kg)     GEN:  Well nourished, well developed in no acute distress HEENT: Normal NECK: No JVD; No carotid bruits CARDIAC: RRR, no murmurs, rubs, gallops RESPIRATORY: Diminished breath sounds, no wheezing ABDOMEN: Soft, non-tender, non-distended MUSCULOSKELETAL:  No edema; No deformity  SKIN: Warm and dry NEUROLOGIC:  Alert and oriented x 3 PSYCHIATRIC:  Normal affect   ASSESSMENT:    1. Coronary artery disease involving native heart, unspecified vessel or lesion type, unspecified whether angina present   2. Primary hypertension   3. Dyslipidemia, goal LDL below 70   4. Exertional dyspnea    PLAN:    In order of problems listed above:  CAD, CTO of proximal LAD, right to left collaterals.  S/p failed CTO intervention 06/2023.  Echo 3/25 EF 60 to 65%.  Continue aspirin  81 mg daily, Lipitor 80 mg daily.  Lopressor  50 mg twice daily for antianginal benefit. BP elevated today.  Increase Lopressor  to 50 mg twice daily. Hyperlipidemia, high intensity statin 80 mg daily. Dyspnea on exertion, normal EF.  Management of COPD as per pulm.  Follow-up in 3 months      Medication Adjustments/Labs and Tests Ordered: Current medicines are reviewed at length with the patient today.  Concerns regarding medicines are outlined above.  Orders Placed This Encounter  Procedures   EKG 12-Lead    Meds ordered this encounter  Medications   metoprolol  tartrate (LOPRESSOR ) 50 MG tablet    Sig: Take 1 tablet (50 mg total) by mouth 2 (two) times daily.    Dispense:  180 tablet    Refill:  3    Patient Instructions  Medication Instructions:  Your physician has recommended you make the following change in your medication:   Increase Lopressor  to 50 mg Two Times Day   *If you need a refill on your cardiac medications before your next appointment, please call your pharmacy*  Lab Work: NONE   If you have labs (blood work) drawn today and your tests are completely normal, you will receive your results only by: MyChart Message (if you have MyChart) OR A paper copy in the mail If you have any lab test that is abnormal or we need to change your treatment, we will call you to review the results.  Testing/Procedures: NONE   Follow-Up: At [facility], you and your health needs are our priority.  As part of our continuing mission to provide you with exceptional heart care, our providers are all part of one team.  This team includes your primary Cardiologist (physician) and Advanced Practice Providers or APPs (Physician Assistants and Nurse Practitioners) who all work together to provide you with the care you need, when you need it.  Your next appointment:   3 month(s)  Provider:   You may see Constancia Delton, MD or one of the following Advanced Practice Providers on your designated Care Team:   Laneta Pintos, NP Gildardo Labrador, PA-C Varney Gentleman, PA-C Cadence Bradford, PA-C Ronald Cockayne, NP Morey Ar, NP    We recommend signing up for the patient portal called "MyChart".  Sign up information is provided on this After Visit Summary.  MyChart is used to connect with patients for Virtual Visits (Telemedicine).  Patients are able to view lab/test results, encounter notes, upcoming appointments, etc.  Non-urgent messages can be sent to your provider as well.   To  learn more about what you can do with MyChart, go to [email or web address].   Other Instructions Thank  you for choosing [facility]!           Signed, Constancia Delton, MD  07/05/2023 10:01 AM    [facility]

## 2023-07-05 NOTE — Patient Instructions (Signed)
 Medication Instructions:  Your physician has recommended you make the following change in your medication:   Increase Lopressor  to 50 mg Two Times Day   *If you need a refill on your cardiac medications before your next appointment, please call your pharmacy*  Lab Work: NONE   If you have labs (blood work) drawn today and your tests are completely normal, you will receive your results only by: MyChart Message (if you have MyChart) OR A paper copy in the mail If you have any lab test that is abnormal or we need to change your treatment, we will call you to review the results.  Testing/Procedures: NONE   Follow-Up: At [facility], you and your health needs are our priority.  As part of our continuing mission to provide you with exceptional heart care, our providers are all part of one team.  This team includes your primary Cardiologist (physician) and Advanced Practice Providers or APPs (Physician Assistants and Nurse Practitioners) who all work together to provide you with the care you need, when you need it.  Your next appointment:   3 month(s)  Provider:   You may see Constancia Delton, MD or one of the following Advanced Practice Providers on your designated Care Team:   Laneta Pintos, NP Gildardo Labrador, PA-C Varney Gentleman, PA-C Cadence Thunderbolt, PA-C Ronald Cockayne, NP Morey Ar, NP    We recommend signing up for the patient portal called "MyChart".  Sign up information is provided on this After Visit Summary.  MyChart is used to connect with patients for Virtual Visits (Telemedicine).  Patients are able to view lab/test results, encounter notes, upcoming appointments, etc.  Non-urgent messages can be sent to your provider as well.   To learn more about what you can do with MyChart, go to [email or web address].   Other Instructions Thank you for choosing [facility]!

## 2023-08-27 ENCOUNTER — Encounter: Payer: Self-pay | Admitting: Student in an Organized Health Care Education/Training Program

## 2023-08-27 ENCOUNTER — Ambulatory Visit: Admitting: Student in an Organized Health Care Education/Training Program

## 2023-08-27 VITALS — BP 132/86 | HR 58 | Temp 96.9°F | Ht 68.0 in | Wt 161.8 lb

## 2023-08-27 DIAGNOSIS — R0602 Shortness of breath: Secondary | ICD-10-CM

## 2023-08-27 DIAGNOSIS — J449 Chronic obstructive pulmonary disease, unspecified: Secondary | ICD-10-CM | POA: Diagnosis not present

## 2023-08-27 DIAGNOSIS — Z87891 Personal history of nicotine dependence: Secondary | ICD-10-CM | POA: Diagnosis not present

## 2023-08-27 DIAGNOSIS — J4540 Moderate persistent asthma, uncomplicated: Secondary | ICD-10-CM | POA: Diagnosis not present

## 2023-08-27 LAB — NITRIC OXIDE: Nitric Oxide: 31

## 2023-08-27 MED ORDER — NICOTINE POLACRILEX 4 MG MT LOZG: 4.0000 mg | LOZENGE | OROMUCOSAL | 3 refills | Status: AC | PRN

## 2023-08-27 NOTE — Progress Notes (Unsigned)
 Synopsis: Referred in *** by [facility]  Assessment & Plan:   1. SOB (shortness of breath) *** - Nitric oxide  - Pulmonary Function Test; Future  2. Personal history of tobacco use, presenting hazards to health *** - nicotine  polacrilex (NICOTINE  MINI) 4 MG lozenge; Take 1 lozenge (4 mg total) by mouth every 2 (two) hours as needed for smoking cessation.  Dispense: 72 lozenge; Refill: 3  3. Chronic obstructive pulmonary disease, unspecified COPD type (HCC) *** - Pulmonary Function Test; Future  4. Moderate persistent asthma without complication (Primary) *** - Pulmonary Function Test; Future  Still short of breath with exertion. Continues to vape. Continue inhalers, repeat spirometry before next visit. Trial claritin . Nicotine  replacement. Vape cessation recommended  Return in about 6 months (around 02/26/2024).  I spent *** minutes caring for this patient today, including {EM billing:[id number]}  Vergia Glasgow, MD [facility] 08/27/2023 9:24 AM    End of visit medications:  Meds ordered this encounter  Medications   nicotine  polacrilex (NICOTINE  MINI) 4 MG lozenge    Sig: Take 1 lozenge (4 mg total) by mouth every 2 (two) hours as needed for smoking cessation.    Dispense:  72 lozenge    Refill:  3     Current Outpatient Medications:    albuterol  (VENTOLIN  HFA) 108 (90 Base) MCG/ACT inhaler, INHALE 1-2 PUFFS BY MOUTH EVERY 6 HOURS AS NEEDED FOR WHEEZE OR SHORTNESS OF BREATH, Disp: 18 each, Rfl: 3   aspirin  EC 81 MG tablet, Take 1 tablet (81 mg total) by mouth daily. Swallow whole., Disp: , Rfl:    atorvastatin  (LIPITOR) 80 MG tablet, Take 1 tablet (80 mg total) by mouth daily., Disp: 90 tablet, Rfl: 3   EPINEPHrine  0.3 mg/0.3 mL IJ SOAJ injection, Inject 0.3 mg into the muscle as needed., Disp: 2 each, Rfl: 0   fluticasone  (FLONASE ) 50 MCG/ACT nasal spray, Place 1 spray into both nostrils as needed. (Patient taking differently: Place 1  spray into both nostrils daily as needed for allergies or rhinitis.), Disp: 16 g, Rfl: 0   Fluticasone -Umeclidin-Vilant (TRELEGY ELLIPTA ) 200-62.5-25 MCG/ACT AEPB, Inhale 1 puff into the lungs daily., Disp: 30 each, Rfl: 12   levocetirizine (XYZAL ) 5 MG tablet, TAKE 1 TABLET BY MOUTH EVERY DAY IN THE EVENING, Disp: 90 tablet, Rfl: 0   metoprolol  tartrate (LOPRESSOR ) 50 MG tablet, Take 1 tablet (50 mg total) by mouth 2 (two) times daily., Disp: 180 tablet, Rfl: 3   montelukast  (SINGULAIR ) 10 MG tablet, TAKE 1 TABLET BY MOUTH EVERYDAY AT BEDTIME (Patient taking differently: Take 10 mg by mouth daily as needed (Allergies).), Disp: 90 tablet, Rfl: 3   nicotine  polacrilex (NICOTINE  MINI) 4 MG lozenge, Take 1 lozenge (4 mg total) by mouth every 2 (two) hours as needed for smoking cessation., Disp: 72 lozenge, Rfl: 3   nitroGLYCERIN  (NITROSTAT ) 0.4 MG SL tablet, Place 1 tablet (0.4 mg total) under the tongue every 5 (five) minutes as needed for chest pain., Disp: 25 tablet, Rfl: 3   pantoprazole  (PROTONIX ) 40 MG tablet, Take 1 tablet (40 mg total) by mouth 2 (two) times daily., Disp: 60 tablet, Rfl: 0   Subjective:   PATIENT ID: Kyle Clark GENDER: male DOB: [birth year], MRN: [id number]  Chief Complaint  Patient presents with   Follow-up    DOE. Some wheezing. Cough.    HPI ***  Ancillary information including prior medications, full medical/surgical/family/social histories, and PFTs (when available) are listed below and have been  reviewed.   ROS   Objective:   Vitals:   08/27/23 0851  BP: 132/86  Pulse: (!) 58  Temp: (!) 96.9 F (36.1 C)  SpO2: 98%  Weight: 161 lb 12.8 oz (73.4 kg)  Height: 5' 8 (1.727 m)   98% on *** LPM *** RA BMI Readings from Last 3 Encounters:  08/27/23 24.60 kg/m  07/05/23 24.52 kg/m  06/14/23 24.63 kg/m   Wt Readings from Last 3 Encounters:  08/27/23 161 lb 12.8 oz (73.4 kg)  07/05/23 161 lb 4 oz (73.1 kg)  06/14/23 162 lb (73.5 kg)     Physical Exam    Ancillary Information    Past Medical History:  Diagnosis Date   Allergic rhinitis    CAD (coronary artery disease)    Hyperlipidemia    Hypertension      Family History  Problem Relation Age of Onset   CAD Mother    Congestive Heart Failure Father    Healthy Sister    Healthy Daughter      Past Surgical History:  Procedure Laterality Date   CORONARY ANGIOGRAPHY N/A 06/14/2023   Procedure: CORONARY ANGIOGRAPHY;  Surgeon: Swaziland, Peter M, MD;  Location: [facility] INVASIVE CV LAB;  Service: Cardiovascular;  Laterality: N/A;   CORONARY CTO INTERVENTION N/A 06/14/2023   Procedure: CORONARY CTO INTERVENTION;  Surgeon: Swaziland, Peter M, MD;  Location: [facility] INVASIVE CV LAB;  Service: Cardiovascular;  Laterality: N/A;   ELBOW SURGERY     01/2021   ESOPHAGOGASTRODUODENOSCOPY N/A 11/17/2020   Procedure: ESOPHAGOGASTRODUODENOSCOPY (EGD) with possible intervention;  Surgeon: Quintin Buckle, DO;  Location: [facility] ENDOSCOPY;  Service: Gastroenterology;  Laterality: N/A;  food impaction   LEFT HEART CATH AND CORONARY ANGIOGRAPHY N/A 06/01/2023   Procedure: LEFT HEART CATH AND CORONARY ANGIOGRAPHY;  Surgeon: Arnoldo Lapping, MD;  Location: [facility] INVASIVE CV LAB;  Service: Cardiovascular;  Laterality: N/A;    Social History   Socioeconomic History   Marital status: Married    Spouse name: Not on file   Number of children: Not on file   Years of education: Not on file   Highest education level: Not on file  Occupational History   Not on file  Tobacco Use   Smoking status: Former    Current packs/day: 0.00    Average packs/day: 1.5 packs/day for 31.0 years (46.5 ttl pk-yrs)    Types: Cigarettes    Quit date: 03/14/2019    Years since quitting: 4.4   Smokeless tobacco: Never  Vaping Use   Vaping status: Every Day   Substances: Nicotine   Substance and Sexual Activity   Alcohol use: Yes    Comment: occasional alcohol use, drinks beer on weekends maybe once a week   Drug  use: No   Sexual activity: Not on file  Other Topics Concern   Not on file  Social History Narrative   Not on file   Social Drivers of Health   Financial Resource Strain: Not on file  Food Insecurity: No Food Insecurity (06/14/2023)   Hunger Vital Sign    Worried About Running Out of Food in the Last Year: Never true    Ran Out of Food in the Last Year: Never true  Transportation Needs: No Transportation Needs (06/14/2023)   PRAPARE - Administrator, Civil Service (Medical): No    Lack of Transportation (Non-Medical): No  Physical Activity: Not on file  Stress: Not on file  Social Connections: Not on file  Intimate Partner Violence: Not  At Risk (06/14/2023)   Humiliation, Afraid, Rape, and Kick questionnaire    Fear of Current or Ex-Partner: No    Emotionally Abused: No    Physically Abused: No    Sexually Abused: No     Allergies  Allergen Reactions   Lisinopril Palpitations    Other reaction(s): Rapid pulse High blood pressure      CBC    Component Value Date/Time   WBC 8.8 06/15/2023 0541   RBC 4.92 06/15/2023 0541   HGB 14.7 06/15/2023 0541   HGB 17.0 05/23/2023 1455   HCT 42.2 06/15/2023 0541   HCT 49.3 05/23/2023 1455   PLT 254 06/15/2023 0541   PLT 296 05/23/2023 1455   MCV 85.8 06/15/2023 0541   MCV 89 05/23/2023 1455   MCH 29.9 06/15/2023 0541   MCHC 34.8 06/15/2023 0541   RDW 11.6 06/15/2023 0541   RDW 12.1 05/23/2023 1455   LYMPHSABS 3.2 11/16/2020 2130   LYMPHSABS 3.9 (H) 05/15/2017 0806   MONOABS 0.5 11/16/2020 2130   EOSABS 0.4 11/16/2020 2130   EOSABS 0.2 05/15/2017 0806   BASOSABS 0.1 11/16/2020 2130   BASOSABS 0.0 05/15/2017 0806    Pulmonary Functions Testing Results:    Latest Ref Rng & Units 01/19/2022    2:32 PM  PFT Results  FVC-Pre L 3.40   FVC-Predicted Pre % 71   FVC-Post L 3.72   FVC-Predicted Post % 78   Pre FEV1/FVC % % 55   Post FEV1/FCV % % 59   FEV1-Pre L 1.87   FEV1-Predicted Pre % 50   FEV1-Post L 2.18    DLCO uncorrected ml/min/mmHg 16.70   DLCO UNC% % 60   DLVA Predicted % 63   TLC L 7.43   TLC % Predicted % 113   RV % Predicted % 182     Outpatient Medications Prior to Visit  Medication Sig Dispense Refill   albuterol  (VENTOLIN  HFA) 108 (90 Base) MCG/ACT inhaler INHALE 1-2 PUFFS BY MOUTH EVERY 6 HOURS AS NEEDED FOR WHEEZE OR SHORTNESS OF BREATH 18 each 3   aspirin  EC 81 MG tablet Take 1 tablet (81 mg total) by mouth daily. Swallow whole.     atorvastatin  (LIPITOR) 80 MG tablet Take 1 tablet (80 mg total) by mouth daily. 90 tablet 3   EPINEPHrine  0.3 mg/0.3 mL IJ SOAJ injection Inject 0.3 mg into the muscle as needed. 2 each 0   fluticasone  (FLONASE ) 50 MCG/ACT nasal spray Place 1 spray into both nostrils as needed. (Patient taking differently: Place 1 spray into both nostrils daily as needed for allergies or rhinitis.) 16 g 0   Fluticasone -Umeclidin-Vilant (TRELEGY ELLIPTA ) 200-62.5-25 MCG/ACT AEPB Inhale 1 puff into the lungs daily. 30 each 12   levocetirizine (XYZAL ) 5 MG tablet TAKE 1 TABLET BY MOUTH EVERY DAY IN THE EVENING 90 tablet 0   metoprolol  tartrate (LOPRESSOR ) 50 MG tablet Take 1 tablet (50 mg total) by mouth 2 (two) times daily. 180 tablet 3   montelukast  (SINGULAIR ) 10 MG tablet TAKE 1 TABLET BY MOUTH EVERYDAY AT BEDTIME (Patient taking differently: Take 10 mg by mouth daily as needed (Allergies).) 90 tablet 3   nitroGLYCERIN  (NITROSTAT ) 0.4 MG SL tablet Place 1 tablet (0.4 mg total) under the tongue every 5 (five) minutes as needed for chest pain. 25 tablet 3   pantoprazole  (PROTONIX ) 40 MG tablet Take 1 tablet (40 mg total) by mouth 2 (two) times daily. 60 tablet 0   No facility-administered medications prior to visit.

## 2023-10-08 ENCOUNTER — Encounter: Payer: Self-pay | Admitting: Cardiology

## 2023-10-08 ENCOUNTER — Ambulatory Visit: Attending: Cardiology | Admitting: Cardiology

## 2023-10-08 VITALS — BP 122/82 | HR 61 | Ht 68.0 in | Wt 162.0 lb

## 2023-10-08 DIAGNOSIS — E785 Hyperlipidemia, unspecified: Secondary | ICD-10-CM | POA: Diagnosis not present

## 2023-10-08 DIAGNOSIS — I2510 Atherosclerotic heart disease of native coronary artery without angina pectoris: Secondary | ICD-10-CM | POA: Diagnosis not present

## 2023-10-08 DIAGNOSIS — I10 Essential (primary) hypertension: Secondary | ICD-10-CM | POA: Diagnosis not present

## 2023-10-08 NOTE — Patient Instructions (Signed)

## 2023-10-08 NOTE — Progress Notes (Signed)
 Cardiology Office Note:    Date:  10/08/2023   ID:  Kyle Clark, DOB 07-24-1970, MRN [id number]  PCP:  [facility]   [facility] Providers Cardiologist:  Redell Cave, MD     Referring MD: [facility]   Chief Complaint  Patient presents with   Follow-up    3 month follow up visit. Patient states that he experiences shortness of breath. Meds reviewed.     History of Present Illness:    Kyle Clark is a 53 y.o. male with a hx of  CAD (CTO proximal LAD s/p failed intervention 06/2023), hypertension, COPD, former smoker x 30+ years presenting for follow-up.  Doing okay, denies chest pain.  Metoprolol  increased to 50 mg twice daily after last visit to help with BP control.  Tolerating all medications as prescribed.  Has not needed to use nitroglycerin  sublingual.  Feels well, denies any new concerns.  Has chronic shortness of breath/COPD, being seen by pulmonary medicine for this.   Prior notes/testing CT chest 12/2022 for lung cancer screening showed LAD and RCA calcifications  Past Medical History:  Diagnosis Date   Allergic rhinitis    CAD (coronary artery disease)    Hyperlipidemia    Hypertension     Past Surgical History:  Procedure Laterality Date   CORONARY ANGIOGRAPHY N/A 06/14/2023   Procedure: CORONARY ANGIOGRAPHY;  Surgeon: Swaziland, Peter M, MD;  Location: [facility] INVASIVE CV LAB;  Service: Cardiovascular;  Laterality: N/A;   CORONARY CTO INTERVENTION N/A 06/14/2023   Procedure: CORONARY CTO INTERVENTION;  Surgeon: Swaziland, Peter M, MD;  Location: [facility] INVASIVE CV LAB;  Service: Cardiovascular;  Laterality: N/A;   ELBOW SURGERY     01/2021   ESOPHAGOGASTRODUODENOSCOPY N/A 11/17/2020   Procedure: ESOPHAGOGASTRODUODENOSCOPY (EGD) with possible intervention;  Surgeon: Onita Elspeth Sharper, DO;  Location: [facility] ENDOSCOPY;  Service: Gastroenterology;  Laterality: N/A;  food impaction   LEFT HEART CATH AND CORONARY ANGIOGRAPHY N/A  06/01/2023   Procedure: LEFT HEART CATH AND CORONARY ANGIOGRAPHY;  Surgeon: Wonda Sharper, MD;  Location: [facility] INVASIVE CV LAB;  Service: Cardiovascular;  Laterality: N/A;    Current Medications: Current Meds  Medication Sig   albuterol  (VENTOLIN  HFA) 108 (90 Base) MCG/ACT inhaler INHALE 1-2 PUFFS BY MOUTH EVERY 6 HOURS AS NEEDED FOR WHEEZE OR SHORTNESS OF BREATH   aspirin  EC 81 MG tablet Take 1 tablet (81 mg total) by mouth daily. Swallow whole.   atorvastatin  (LIPITOR) 80 MG tablet Take 1 tablet (80 mg total) by mouth daily.   EPINEPHrine  0.3 mg/0.3 mL IJ SOAJ injection Inject 0.3 mg into the muscle as needed.   fluticasone  (FLONASE ) 50 MCG/ACT nasal spray Place 1 spray into both nostrils as needed. (Patient taking differently: Place 1 spray into both nostrils daily as needed for allergies or rhinitis.)   Fluticasone -Umeclidin-Vilant (TRELEGY ELLIPTA ) 200-62.5-25 MCG/ACT AEPB Inhale 1 puff into the lungs daily.   levocetirizine (XYZAL ) 5 MG tablet TAKE 1 TABLET BY MOUTH EVERY DAY IN THE EVENING   metoprolol  tartrate (LOPRESSOR ) 50 MG tablet Take 1 tablet (50 mg total) by mouth 2 (two) times daily.   montelukast  (SINGULAIR ) 10 MG tablet TAKE 1 TABLET BY MOUTH EVERYDAY AT BEDTIME (Patient taking differently: Take 10 mg by mouth daily as needed (Allergies).)   nicotine  polacrilex (NICOTINE  MINI) 4 MG lozenge Take 1 lozenge (4 mg total) by mouth every 2 (two) hours as needed for smoking cessation.   nitroGLYCERIN  (NITROSTAT ) 0.4 MG SL tablet Place 1  tablet (0.4 mg total) under the tongue every 5 (five) minutes as needed for chest pain.   pantoprazole  (PROTONIX ) 40 MG tablet Take 1 tablet (40 mg total) by mouth 2 (two) times daily.     Allergies:   Lisinopril   Social History   Socioeconomic History   Marital status: Married    Spouse name: Not on file   Number of children: Not on file   Years of education: Not on file   Highest education level: Not on file  Occupational History   Not on  file  Tobacco Use   Smoking status: Former    Current packs/day: 0.00    Average packs/day: 1.5 packs/day for 31.0 years (46.5 ttl pk-yrs)    Types: Cigarettes    Quit date: 03/14/2019    Years since quitting: 4.5   Smokeless tobacco: Never  Vaping Use   Vaping status: Every Day   Substances: Nicotine   Substance and Sexual Activity   Alcohol use: Yes    Comment: occasional alcohol use, drinks beer on weekends maybe once a week   Drug use: No   Sexual activity: Not on file  Other Topics Concern   Not on file  Social History Narrative   Not on file   Social Drivers of Health   Financial Resource Strain: Not on file  Food Insecurity: No Food Insecurity (06/14/2023)   Hunger Vital Sign    Worried About Running Out of Food in the Last Year: Never true    Ran Out of Food in the Last Year: Never true  Transportation Needs: No Transportation Needs (06/14/2023)   PRAPARE - Administrator, Civil Service (Medical): No    Lack of Transportation (Non-Medical): No  Physical Activity: Not on file  Stress: Not on file  Social Connections: Not on file     Family History: The patient's family history includes CAD in his mother; Congestive Heart Failure in his father; Healthy in his daughter and sister.  ROS:   Please see the history of present illness.     All other systems reviewed and are negative.  EKGs/Labs/Other Studies Reviewed:    The following studies were reviewed today:       Recent Labs: 05/23/2023: ALT 53 06/15/2023: BUN 9; Creatinine, Ser 1.01; Hemoglobin 14.7; Platelets 254; Potassium 4.5; Sodium 136  Recent Lipid Panel    Component Value Date/Time   CHOL 172 05/23/2023 1455   TRIG 215 (H) 05/23/2023 1455   HDL 51 05/23/2023 1455   CHOLHDL 3.4 05/23/2023 1455   LDLCALC 85 05/23/2023 1455   LDLDIRECT 90 05/23/2023 1455     Risk Assessment/Calculations:         Physical Exam:    VS:  BP 122/82   Pulse 61   Ht 5' 8 (1.727 m)   Wt 162 lb (73.5 kg)    SpO2 95%   BMI 24.63 kg/m     Wt Readings from Last 3 Encounters:  10/08/23 162 lb (73.5 kg)  08/27/23 161 lb 12.8 oz (73.4 kg)  07/05/23 161 lb 4 oz (73.1 kg)     GEN:  Well nourished, well developed in no acute distress HEENT: Normal NECK: No JVD; No carotid bruits CARDIAC: RRR, no murmurs, rubs, gallops RESPIRATORY: Diminished breath sounds, no wheezing ABDOMEN: Soft, non-tender, non-distended MUSCULOSKELETAL:  No edema; No deformity  SKIN: Warm and dry NEUROLOGIC:  Alert and oriented x 3 PSYCHIATRIC:  Normal affect   ASSESSMENT:    1. Coronary artery  disease involving native heart, unspecified vessel or lesion type, unspecified whether angina present   2. Primary hypertension   3. Dyslipidemia, goal LDL below 70     PLAN:    In order of problems listed above:  CAD, CTO of proximal LAD, right to left collaterals.  S/p failed CTO intervention 06/2023.  Echo 3/25 EF 60 to 65%.  Continue aspirin  81 mg daily, Lipitor 80 mg daily. continue Lopressor  50 mg twice daily for antianginal benefit. BP controlled.  Continue Lopressor  50 mg twice daily. Hyperlipidemia, continue Lipitor 80 mg daily.  Follow-up in 12 months      Medication Adjustments/Labs and Tests Ordered: Current medicines are reviewed at length with the patient today.  Concerns regarding medicines are outlined above.  No orders of the defined types were placed in this encounter.  No orders of the defined types were placed in this encounter.   Patient Instructions  Medication Instructions:  Your physician recommends that you continue on your current medications as directed. Please refer to the Current Medication list given to you today.   *If you need a refill on your cardiac medications before your next appointment, please call your pharmacy*  Lab Work: No labs ordered today  If you have labs (blood work) drawn today and your tests are completely normal, you will receive your results only by: MyChart  Message (if you have MyChart) OR A paper copy in the mail If you have any lab test that is abnormal or we need to change your treatment, we will call you to review the results.  Testing/Procedures: No test ordered today   Follow-Up: At [facility], you and your health needs are our priority.  As part of our continuing mission to provide you with exceptional heart care, our providers are all part of one team.  This team includes your primary Cardiologist (physician) and Advanced Practice Providers or APPs (Physician Assistants and Nurse Practitioners) who all work together to provide you with the care you need, when you need it.  Your next appointment:   1 year(s)  Provider:   You may see Redell Cave, MD or one of the following Advanced Practice Providers on your designated Care Team:   Lonni Meager, NP Lesley Maffucci, PA-C Bernardino Bring, PA-C Cadence [place], PA-C Tylene Lunch, NP Barnie Hila, NP    We recommend signing up for the patient portal called MyChart.  Sign up information is provided on this After Visit Summary.  MyChart is used to connect with patients for Virtual Visits (Telemedicine).  Patients are able to view lab/test results, encounter notes, upcoming appointments, etc.  Non-urgent messages can be sent to your provider as well.   To learn more about what you can do with MyChart, go to [email or web address].          Signed, Redell Cave, MD  10/08/2023 9:27 AM    [facility]

## 2023-12-01 ENCOUNTER — Other Ambulatory Visit: Payer: Self-pay | Admitting: Student in an Organized Health Care Education/Training Program

## 2023-12-01 DIAGNOSIS — R0602 Shortness of breath: Secondary | ICD-10-CM

## 2023-12-01 DIAGNOSIS — J449 Chronic obstructive pulmonary disease, unspecified: Secondary | ICD-10-CM

## 2023-12-01 DIAGNOSIS — J4540 Moderate persistent asthma, uncomplicated: Secondary | ICD-10-CM

## 2023-12-28 ENCOUNTER — Ambulatory Visit: Admission: RE | Admit: 2023-12-28 | Source: Ambulatory Visit

## 2024-01-01 ENCOUNTER — Ambulatory Visit
Admission: RE | Admit: 2024-01-01 | Discharge: 2024-01-01 | Disposition: A | Discharge status: Home / Self Care | Source: Ambulatory Visit | Attending: Acute Care | Admitting: Acute Care

## 2024-01-01 DIAGNOSIS — Z87891 Personal history of nicotine dependence: Secondary | ICD-10-CM | POA: Insufficient documentation

## 2024-01-01 DIAGNOSIS — Z122 Encounter for screening for malignant neoplasm of respiratory organs: Secondary | ICD-10-CM | POA: Diagnosis present

## 2024-01-07 ENCOUNTER — Telehealth: Payer: Self-pay | Admitting: Acute Care

## 2024-01-07 ENCOUNTER — Other Ambulatory Visit: Payer: Self-pay

## 2024-01-07 DIAGNOSIS — Z122 Encounter for screening for malignant neoplasm of respiratory organs: Secondary | ICD-10-CM

## 2024-01-07 DIAGNOSIS — Z87891 Personal history of nicotine dependence: Secondary | ICD-10-CM

## 2024-01-07 DIAGNOSIS — R911 Solitary pulmonary nodule: Secondary | ICD-10-CM

## 2024-01-07 NOTE — Telephone Encounter (Signed)
 Provider called and spoke with the patient. See provider notes from 01/07/2024 for update.

## 2024-01-07 NOTE — Telephone Encounter (Signed)
 Results and plan to PCP. 3 month follow up order placed.

## 2024-01-07 NOTE — Telephone Encounter (Signed)
 I have called the patient with the results of his low dose Ct Chest. His scan was read as a LR 4 B. Per radiology they feel this is infectious or inflammatory. Pt. States he had a sinus infection at the time. Plan will be for a 3 month follow up due 03/2024. Please fax results to PCP and let them know plan of care. Please order 3 month follow up scan. Thanks so much.

## 2024-01-07 NOTE — Telephone Encounter (Signed)
 Call report LDCT:   IMPRESSION: 1. Lung-RADS 4B, suspicious. Additional imaging evaluation or consultation with Pulmonology or Thoracic Surgery recommended. New nodular and bandlike focus of consolidation in the posterior apical right upper lobe measuring 20.1 mm in volume derived mean diameter, favor evolving post-infectious/post-inflammatory scarring. Follow up low-dose chest CT without contrast in 3 months (please use the following order, CT CHEST LCS NODULE FOLLOW-UP W/O CM) is recommended. 2. Three-vessel coronary atherosclerosis. 3. Aortic Atherosclerosis (ICD10-I70.0) and Emphysema (ICD10-J43.9).   These results will be called to the ordering clinician or representative by the Radiologist Assistant, and communication documented in the PACS or [employer].     Electronically Signed   By: Selinda DELENA Blue M.D.   On: 01/04/2024 14:20

## 2024-01-21 ENCOUNTER — Encounter

## 2024-04-03 ENCOUNTER — Ambulatory Visit
Admission: RE | Admit: 2024-04-03 | Discharge: 2024-04-03 | Disposition: A | Discharge status: Home / Self Care | Source: Ambulatory Visit | Attending: Acute Care | Admitting: Acute Care

## 2024-04-03 DIAGNOSIS — Z87891 Personal history of nicotine dependence: Secondary | ICD-10-CM | POA: Diagnosis present

## 2024-04-03 DIAGNOSIS — R911 Solitary pulmonary nodule: Secondary | ICD-10-CM | POA: Insufficient documentation

## 2024-04-03 DIAGNOSIS — Z122 Encounter for screening for malignant neoplasm of respiratory organs: Secondary | ICD-10-CM | POA: Insufficient documentation

## 2024-04-09 ENCOUNTER — Telehealth: Payer: Self-pay | Admitting: Acute Care

## 2024-04-09 NOTE — Telephone Encounter (Signed)
 This is a 4 B. He is your patient. He had a 4 B 3 months ago that radiology felt was infectious or inflammatory, so we did a 3 month repeat scan. The right apical lesion of concern seen on the previous study has evolved in the interval, but they wonder if it is related to the reorientation of the previous lesion secondary to evolution of underlying bullous disease and scarring in the right apex. Can you take a look and let me know if you want us  to PET this or if you want to see them in the office. Thanks

## 2024-04-11 ENCOUNTER — Telehealth: Payer: Self-pay | Admitting: Acute Care

## 2024-04-11 ENCOUNTER — Encounter: Payer: Self-pay | Admitting: *Deleted

## 2024-04-11 DIAGNOSIS — R911 Solitary pulmonary nodule: Secondary | ICD-10-CM

## 2024-04-11 NOTE — Telephone Encounter (Signed)
 I have attempted to call the patient with the results of their  Low Dose CT Chest Lung cancer screening scan. There was no answer. There was no option to leave a message. We have sent a my chart message to give us  a call so we can review his scan with hi,.We will await his return call. If no return call we will continue to call until patient is contacted.    Scan was read as a 4 B. He is a patient of Dr. Isadora. Plan is for a PET scan and follow up with Dgayli. I will place the PET scan order once we get in touch with the patient.

## 2024-04-11 NOTE — Telephone Encounter (Signed)
 I have called the patient with the results of his low-dose screening CT. His scan was read as a lung RADS 4B. The scan was reviewed by Dr. Isadora in the [facility] pulmonary office and he felt the patient needed a PET scan and then follow-up with him in the office in [place]. Patient and I have discussed the results of his scan, and he is in agreement with the PET scan and follow-up with Dr. Isadora in the [facility] office Please place fax results to PCP and let them know plan of care.

## 2024-04-11 NOTE — Telephone Encounter (Signed)
 See 04/11/2024 provider note for update. Provider has outreached the patient.

## 2024-04-11 NOTE — Telephone Encounter (Signed)
 Per secure chat, once [place], NP speaks with patient, he will need a PET and OV with Dgayli to review results.

## 2024-04-11 NOTE — Telephone Encounter (Signed)
 See other telephone note from 04/11/24

## 2024-04-11 NOTE — Telephone Encounter (Signed)
 Results/ plans faxed to PCP. Reminder set to follow for PET results and Dr Isadora f/u.

## 2024-04-23 ENCOUNTER — Ambulatory Visit

## 2024-04-30 ENCOUNTER — Ambulatory Visit: Admitting: Student in an Organized Health Care Education/Training Program
